# Patient Record
Sex: Female | Born: 2000 | Hispanic: Yes | Marital: Single | Smoking: Never smoker
Health system: Southern US, Community
[De-identification: ages and names within clinical notes are randomized; demographics above are authoritative.]

## PROBLEM LIST (undated history)

## (undated) DIAGNOSIS — F419 Anxiety disorder, unspecified: Secondary | ICD-10-CM

## (undated) DIAGNOSIS — F32A Depression, unspecified: Secondary | ICD-10-CM

---

## 2019-12-20 ENCOUNTER — Encounter (HOSPITAL_COMMUNITY): Payer: Self-pay

## 2019-12-20 ENCOUNTER — Ambulatory Visit (HOSPITAL_COMMUNITY)
Admission: EM | Admit: 2019-12-20 | Discharge: 2019-12-20 | Disposition: A | Discharge status: Home / Self Care | Attending: Internal Medicine | Admitting: Internal Medicine

## 2019-12-20 ENCOUNTER — Other Ambulatory Visit: Payer: Self-pay

## 2019-12-20 DIAGNOSIS — N10 Acute tubulo-interstitial nephritis: Secondary | ICD-10-CM | POA: Diagnosis not present

## 2019-12-20 LAB — POCT URINALYSIS DIP (DEVICE)
Bilirubin Urine: NEGATIVE
Glucose, UA: NEGATIVE mg/dL
Ketones, ur: NEGATIVE mg/dL
Nitrite: POSITIVE — AB
Protein, ur: NEGATIVE mg/dL
Specific Gravity, Urine: 1.02 (ref 1.005–1.030)
Urobilinogen, UA: 0.2 mg/dL (ref 0.0–1.0)
pH: 6 (ref 5.0–8.0)

## 2019-12-20 MED ORDER — CIPROFLOXACIN HCL 500 MG PO TABS: 500.0000 mg | ORAL_TABLET | Freq: Two times a day (BID) | ORAL | 0 refills | Status: DC

## 2019-12-20 NOTE — ED Provider Notes (Signed)
Anton    CSN: [id number] Arrival date & time: 12/20/19  1633      History   Chief Complaint Chief Complaint  Patient presents with  . Urinary Tract Infection    HPI Lindsey Castro is a 19 y.o. female comes to urgent care with complaints of left flank pain, chills, abdominal pain and vomiting of 2 days duration.  Patient says symptoms started a couple of days ago and has been persistent.  She denies any dysuria urgency but admits to having some frequency.  No vaginal discharge.  Patient is sexually active but uses condoms consistently.  No dizziness, near syncope or syncopal episodes.  Patient denies any history of bloody urine or kidney stones.Marland Kitchen   HPI  History reviewed. No pertinent past medical history.  There are no problems to display for this patient.   History reviewed. No pertinent surgical history.  OB History   No obstetric history on file.      Home Medications    Prior to Admission medications   Medication Sig Start Date End Date Taking? Authorizing Provider  ciprofloxacin (CIPRO) 500 MG tablet Take 1 tablet (500 mg total) by mouth 2 (two) times daily for 5 days. 12/20/19 12/25/19  Chase Picket, MD    Family History Family History  Problem Relation Age of Onset  . Healthy Mother   . Healthy Father     Social History Social History   Tobacco Use  . Smoking status: Never Smoker  . Smokeless tobacco: Never Used  Substance Use Topics  . Alcohol use: Yes  . Drug use: Never     Allergies   Patient has no known allergies.   Review of Systems Review of Systems  Constitutional: Positive for activity change, chills and fever. Negative for fatigue.  Gastrointestinal: Positive for abdominal pain. Negative for nausea and vomiting.  Genitourinary: Positive for flank pain and frequency. Negative for dyspareunia, dysuria, urgency, vaginal bleeding and vaginal discharge.  Musculoskeletal: Negative for arthralgias and gait problem.   Neurological: Negative for dizziness, light-headedness and headaches.  Psychiatric/Behavioral: Negative for confusion and decreased concentration.     Physical Exam Triage Vital Signs ED Triage Vitals  Enc Vitals Group     BP 12/20/19 1737 111/66     Pulse Rate 12/20/19 1737 (!) 109     Resp 12/20/19 1737 16     Temp 12/20/19 1737 (!) 100.7 F (38.2 C)     Temp Source 12/20/19 1737 Oral     SpO2 12/20/19 1737 100 %     Weight 12/20/19 1735 148 lb 9.6 oz (67.4 kg)     Height --      Head Circumference --      Peak Flow --      Pain Score 12/20/19 1734 0     Pain Loc --      Pain Edu? --      Excl. in [place]? --    No data found.  Updated Vital Signs BP 111/66 (BP Location: Right Arm)   Pulse (!) 109   Temp (!) 100.7 F (38.2 C) (Oral)   Resp 16   Wt 67.4 kg   LMP 11/27/2019   SpO2 100%   Visual Acuity Right Eye Distance:   Left Eye Distance:   Bilateral Distance:    Right Eye Near:   Left Eye Near:    Bilateral Near:     Physical Exam Cardiovascular:     Rate and Rhythm: Normal rate and regular  rhythm.  Pulmonary:     Effort: Pulmonary effort is normal.     Breath sounds: Normal breath sounds.  Abdominal:     General: Bowel sounds are normal.     Palpations: Abdomen is soft.     Tenderness: There is no abdominal tenderness. There is left CVA tenderness. There is no rebound.  Musculoskeletal:        General: No swelling or signs of injury. Normal range of motion.     Right lower leg: No edema.  Skin:    General: Skin is warm.     Capillary Refill: Capillary refill takes less than 2 seconds.     Findings: No bruising or erythema.      UC Treatments / Results  Labs (all labs ordered are listed, but only abnormal results are displayed) Labs Reviewed  POCT URINALYSIS DIP (DEVICE) - Abnormal; Notable for the following components:      Result Value   Hgb urine dipstick SMALL (*)    Nitrite POSITIVE (*)    Leukocytes,Ua SMALL (*)    All other components  within normal limits  URINE CULTURE    EKG   Radiology No results found.  Procedures Procedures (including critical care time)  Medications Ordered in UC Medications - No data to display  Initial Impression / Assessment and Plan / UC Course  I have reviewed the triage vital signs and the nursing notes.  Pertinent labs & imaging results that were available during my care of the patient were reviewed by me and considered in my medical decision making (see chart for details).     1.  Acute left pyelonephritis: Urinalysis is significant for blood, leukocyte esterase and nitrites. Ciprofloxacin 500 mg twice daily for 5 days Urine cultures have been sent Return precautions given If patient develops any worsening of abdominal pain, flank pain, nausea or vomiting she is advised to return to urgent care to be reevaluated. Final Clinical Impressions(s) / UC Diagnoses   Final diagnoses:  Acute pyelonephritis   Discharge Instructions   None    ED Prescriptions    Medication Sig Dispense Auth. Provider   ciprofloxacin (CIPRO) 500 MG tablet Take 1 tablet (500 mg total) by mouth 2 (two) times daily for 5 days. 10 tablet Santez Woodcox, Britta Mccreedy, MD     PDMP not reviewed this encounter.   Merrilee Jansky, MD 12/21/19 [phone]

## 2019-12-20 NOTE — ED Triage Notes (Signed)
Pt is here with lower back pain, chills, abdominal pain, vomitted that started 2 hours ago. Pt has not taken any meds to relieve discomfort.

## 2019-12-22 ENCOUNTER — Other Ambulatory Visit: Payer: Self-pay

## 2019-12-22 ENCOUNTER — Emergency Department (HOSPITAL_COMMUNITY)
Admission: EM | Admit: 2019-12-22 | Discharge: 2019-12-22 | Disposition: A | Discharge status: Home / Self Care | Attending: Emergency Medicine | Admitting: Emergency Medicine

## 2019-12-22 ENCOUNTER — Encounter (HOSPITAL_COMMUNITY): Payer: Self-pay | Admitting: Emergency Medicine

## 2019-12-22 DIAGNOSIS — Z79899 Other long term (current) drug therapy: Secondary | ICD-10-CM | POA: Insufficient documentation

## 2019-12-22 DIAGNOSIS — N12 Tubulo-interstitial nephritis, not specified as acute or chronic: Secondary | ICD-10-CM | POA: Insufficient documentation

## 2019-12-22 DIAGNOSIS — R109 Unspecified abdominal pain: Secondary | ICD-10-CM | POA: Diagnosis present

## 2019-12-22 LAB — CBC
HCT: 42 % (ref 36.0–46.0)
Hemoglobin: 13.4 g/dL (ref 12.0–15.0)
MCH: 30.9 pg (ref 26.0–34.0)
MCHC: 31.9 g/dL (ref 30.0–36.0)
MCV: 96.8 fL (ref 80.0–100.0)
Platelets: 197 10*3/uL (ref 150–400)
RBC: 4.34 MIL/uL (ref 3.87–5.11)
RDW: 12.9 % (ref 11.5–15.5)
WBC: 13.1 10*3/uL — ABNORMAL HIGH (ref 4.0–10.5)
nRBC: 0 % (ref 0.0–0.2)

## 2019-12-22 LAB — URINE CULTURE: Culture: >=100000 — AB

## 2019-12-22 LAB — BASIC METABOLIC PANEL
Anion gap: 10 (ref 5–15)
BUN: 11 mg/dL (ref 6–20)
CO2: 25 mmol/L (ref 22–32)
Calcium: 9 mg/dL (ref 8.9–10.3)
Chloride: 105 mmol/L (ref 98–111)
Creatinine, Ser: 0.91 mg/dL (ref 0.44–1.00)
GFR calc Af Amer: >60 mL/min (ref >60)
GFR calc non Af Amer: >60 mL/min (ref >60)
Glucose, Bld: 112 mg/dL — ABNORMAL HIGH (ref 70–99)
Potassium: 3.4 mmol/L — ABNORMAL LOW (ref 3.5–5.1)
Sodium: 140 mmol/L (ref 135–145)

## 2019-12-22 LAB — URINALYSIS, ROUTINE W REFLEX MICROSCOPIC
Bilirubin Urine: NEGATIVE
Glucose, UA: NEGATIVE mg/dL
Ketones, ur: 20 mg/dL — AB
Nitrite: POSITIVE — AB
Protein, ur: 100 mg/dL — AB
RBC / HPF: >50 RBC/hpf — ABNORMAL HIGH (ref 0–5)
Specific Gravity, Urine: 1.017 (ref 1.005–1.030)
pH: 6 (ref 5.0–8.0)

## 2019-12-22 LAB — PREGNANCY, URINE: Preg Test, Ur: NEGATIVE

## 2019-12-22 MED ORDER — SODIUM CHLORIDE 0.9 % IV SOLN
1.0000 g | Freq: Once | INTRAVENOUS | Status: AC
  Administered 2019-12-22: 1 g via INTRAVENOUS
  Filled 2019-12-22: qty 10

## 2019-12-22 MED ORDER — HYDROCODONE-ACETAMINOPHEN 5-325 MG PO TABS
1.0000 | ORAL_TABLET | Freq: Once | ORAL | Status: AC
  Administered 2019-12-22: 1 via ORAL
  Filled 2019-12-22: qty 1

## 2019-12-22 MED ORDER — ONDANSETRON HCL 4 MG/2ML IJ SOLN
4.0000 mg | Freq: Once | INTRAMUSCULAR | Status: AC
  Administered 2019-12-22: 4 mg via INTRAVENOUS
  Filled 2019-12-22: qty 2

## 2019-12-22 MED ORDER — KETOROLAC TROMETHAMINE 30 MG/ML IJ SOLN
15.0000 mg | Freq: Once | INTRAMUSCULAR | Status: AC
  Administered 2019-12-22: 15 mg via INTRAVENOUS
  Filled 2019-12-22: qty 1

## 2019-12-22 MED ORDER — SODIUM CHLORIDE 0.9 % IV BOLUS
1000.0000 mL | Freq: Once | INTRAVENOUS | Status: AC
  Administered 2019-12-22: 1000 mL via INTRAVENOUS

## 2019-12-22 MED ORDER — ONDANSETRON 4 MG PO TBDP: 4.0000 mg | ORAL_TABLET | Freq: Three times a day (TID) | ORAL | 0 refills | Status: DC | PRN

## 2019-12-22 MED ORDER — CEPHALEXIN 500 MG PO CAPS: 500.0000 mg | ORAL_CAPSULE | Freq: Four times a day (QID) | ORAL | 0 refills | Status: AC

## 2019-12-22 NOTE — ED Triage Notes (Signed)
Patient is complaining of vomiting, left lower back pain, and nausea. Patient went to urgent care two days ago and patient states the pharmacy lost her prescription.

## 2019-12-22 NOTE — ED Provider Notes (Signed)
[facility] DEPT Provider Note   CSN: [id number] Arrival date & time: 12/22/19  0034     History Chief Complaint  Patient presents with  . Urinary Tract Infection    Lindsey Castro is a 19 y.o. female.  19 year old female presents to the emergency department for evaluation of persistent left-sided flank pain. She states that pain has been worsening over the past 2 days. She initially presented to urgent care 48 hours ago and was diagnosed with a UTI. She was unable to pick up her antibiotic and her symptoms worsened today. She had 2 episodes of vomiting prior to arrival. Reports persistent urinary frequency and urgency with darker color to her urine. She has not noted any fevers, but does report cold chills.  Denies hematuria, dysuria, bowel changes, hx of abdominal surgeries.  Urine culture from 12/20/19 resulting with >100,000 colonies gram negative rods.  The history is provided by the patient. No language interpreter was used.  Urinary Tract Infection      History reviewed. No pertinent past medical history.  There are no problems to display for this patient.   History reviewed. No pertinent surgical history.   OB History   No obstetric history on file.     Family History  Problem Relation Age of Onset  . Healthy Mother   . Healthy Father     Social History   Tobacco Use  . Smoking status: Never Smoker  . Smokeless tobacco: Never Used  Substance Use Topics  . Alcohol use: Yes  . Drug use: Never    Home Medications Prior to Admission medications   Medication Sig Start Date End Date Taking? Authorizing Provider  busPIRone (BUSPAR) 5 MG tablet Take 10 mg by mouth daily as needed for anxiety. 09/09/19  Yes [provider]  FLUoxetine (PROZAC) 20 MG capsule Take 20 mg by mouth daily. 12/04/19  Yes [provider]  cephALEXin (KEFLEX) 500 MG capsule Take 1 capsule (500 mg total) by mouth 4 (four) times daily for 10  days. 12/22/19 01/01/20  Antonietta Breach, PA-C  ondansetron (ZOFRAN ODT) 4 MG disintegrating tablet Take 1 tablet (4 mg total) by mouth every 8 (eight) hours as needed for nausea or vomiting. 12/22/19   Antonietta Breach, PA-C    Allergies    Patient has no known allergies.  Review of Systems   Review of Systems  Ten systems reviewed and are negative for acute change, except as noted in the HPI.    Physical Exam Updated Vital Signs BP (!) 101/57   Pulse (!) 120   Temp 99.7 F (37.6 C) (Oral)   Resp 18   Ht 5\' 4"  (1.626 m)   Wt 62.6 kg   LMP 11/27/2019   SpO2 99%   BMI 23.69 kg/m   Physical Exam Vitals and nursing note reviewed.  Constitutional:      General: She is not in acute distress.    Appearance: She is well-developed. She is not diaphoretic.     Comments: Tearful. Nontoxic.  HENT:     Head: Normocephalic and atraumatic.  Eyes:     General: No scleral icterus.    Conjunctiva/sclera: Conjunctivae normal.  Pulmonary:     Effort: Pulmonary effort is normal. No respiratory distress.     Comments: Respirations even and unlabored Abdominal:     Palpations: Abdomen is soft. There is no mass.     Tenderness: There is abdominal tenderness (mild, L mid abdomen). There is no guarding.  Comments: Soft, nondistended. No peritoneal signs or guarding.  Musculoskeletal:        General: Normal range of motion.     Cervical back: Normal range of motion.  Skin:    General: Skin is warm and dry.     Coloration: Skin is not pale.     Findings: No erythema or rash.  Neurological:     General: No focal deficit present.     Mental Status: She is alert and oriented to person, place, and time.     Coordination: Coordination normal.  Psychiatric:        Behavior: Behavior normal.     ED Results / Procedures / Treatments   Labs (all labs ordered are listed, but only abnormal results are displayed) Labs Reviewed  URINALYSIS, ROUTINE W REFLEX MICROSCOPIC - Abnormal; Notable for the  following components:      Result Value   APPearance CLOUDY (*)    Hgb urine dipstick LARGE (*)    Ketones, ur 20 (*)    Protein, ur 100 (*)    Nitrite POSITIVE (*)    Leukocytes,Ua LARGE (*)    RBC / HPF >50 (*)    Bacteria, UA RARE (*)    All other components within normal limits  CBC - Abnormal; Notable for the following components:   WBC 13.1 (*)    All other components within normal limits  BASIC METABOLIC PANEL - Abnormal; Notable for the following components:   Potassium 3.4 (*)    Glucose, Bld 112 (*)    All other components within normal limits  PREGNANCY, URINE    EKG None  Radiology No results found.  Procedures Procedures (including critical care time)  Medications Ordered in ED Medications  sodium chloride 0.9 % bolus 1,000 mL (0 mLs Intravenous Stopped 12/22/19 0244)  cefTRIAXone (ROCEPHIN) 1 g in sodium chloride 0.9 % 100 mL IVPB (0 g Intravenous Stopped 12/22/19 0244)  ketorolac (TORADOL) 30 MG/ML injection 15 mg (15 mg Intravenous Given 12/22/19 0143)  HYDROcodone-acetaminophen (NORCO/VICODIN) 5-325 MG per tablet 1 tablet (1 tablet Oral Given 12/22/19 0132)  ondansetron (ZOFRAN) injection 4 mg (4 mg Intravenous Given 12/22/19 0143)    ED Course  I have reviewed the triage vital signs and the nursing notes.  Pertinent labs & imaging results that were available during my care of the patient were reviewed by me and considered in my medical decision making (see chart for details).  Clinical Course as of Dec 21 253  Wed Dec 22, 2019  0253 Patient no longer tachycardic.  She states that she is feeling better.  Plan for discharge on Keflex 4 times daily.   [KH]    Clinical Course User Index [KH] Antony Madura, PA-C   MDM Rules/Calculators/A&P                      19 year old nontoxic appearing female with clinical findings consistent with uncomplicated pyelonephritis.  She was noted to be tachycardic on arrival.  This has improved with pain control and IV  fluids.  Given initial dose of IV Rocephin as urine culture from 2 days ago positive for gram-negative rods; likely E. coli.  Her urinalysis today reflective of persistent cystitis.  While she does have large hemoglobin and >50 RBCs, this appears to be a progression from her initial UA and is favored to be due to hemorrhagic cystitis.  Will discharge on Keflex with instruction for PCP follow up.  Return precautions discussed and provided.  Patient discharged in stable condition with no unaddressed concerns.   Final Clinical Impression(s) / ED Diagnoses Final diagnoses:  Pyelonephritis    Rx / DC Orders ED Discharge Orders         Ordered    cephALEXin (KEFLEX) 500 MG capsule  4 times daily     12/22/19 0254    ondansetron (ZOFRAN ODT) 4 MG disintegrating tablet  Every 8 hours PRN     12/22/19 0255           Antony Madura, PA-C 12/22/19 0258    Molpus, Jonny Ruiz, MD 12/22/19 [phone]

## 2019-12-22 NOTE — ED Triage Notes (Signed)
Patient requested to urinate. Patient states she already has used the bathroom.

## 2019-12-22 NOTE — ED Notes (Addendum)
PT currently using lobby restroom

## 2019-12-22 NOTE — Discharge Instructions (Signed)
Take Keflex as prescribed until finished.  We recommend 600 mg ibuprofen every 6 hours for management of pain.  You may use Zofran as prescribed for nausea management.  Follow-up with a primary care doctor to ensure resolution of symptoms.

## 2019-12-24 ENCOUNTER — Ambulatory Visit (HOSPITAL_COMMUNITY)
Admission: EM | Admit: 2019-12-24 | Discharge: 2019-12-24 | Disposition: A | Discharge status: Home / Self Care | Attending: Internal Medicine | Admitting: Internal Medicine

## 2019-12-24 ENCOUNTER — Encounter (HOSPITAL_COMMUNITY): Payer: Self-pay

## 2019-12-24 ENCOUNTER — Other Ambulatory Visit: Payer: Self-pay

## 2019-12-24 ENCOUNTER — Ambulatory Visit (INDEPENDENT_AMBULATORY_CARE_PROVIDER_SITE_OTHER)

## 2019-12-24 DIAGNOSIS — N10 Acute tubulo-interstitial nephritis: Secondary | ICD-10-CM | POA: Diagnosis not present

## 2019-12-24 DIAGNOSIS — R109 Unspecified abdominal pain: Secondary | ICD-10-CM | POA: Diagnosis not present

## 2019-12-24 NOTE — ED Provider Notes (Signed)
Lindsey Castro    CSN: [id number] Arrival date & time: 12/24/19  0809      History   Chief Complaint Chief Complaint  Patient presents with  . Kidney Infection    HPI Lindsey Castro is a 19 y.o. female recently diagnosed with pyelonephritis and being treated with Keflex 500 mg 4 times daily for 10 days comes to urgent care with complaint of bloody urine since yesterday.  Patient was initially diagnosed with pyelonephritis by me.  At that time I prescribed Keflex but the patient did not fill that prescription.  She went to the emergency department on account of worsening symptoms and the dose of Keflex was increased to 4 times daily for 10 days.  Patient is on day 2 of Keflex and she noticed increased bloody urine.  Left flank pain is improved.  No fever or chills.  No nausea or vomiting.  No dizziness, near syncope or syncopal episodes.  Patient came to urgent care to be reevaluated.  Urine cultures are growing pansensitive E. coli.  HPI  History reviewed. No pertinent past medical history.  There are no problems to display for this patient.   History reviewed. No pertinent surgical history.  OB History   No obstetric history on file.      Home Medications    Prior to Admission medications   Medication Sig Start Date End Date Taking? Authorizing Provider  busPIRone (BUSPAR) 5 MG tablet Take 10 mg by mouth daily as needed for anxiety. 09/09/19   [provider]  cephALEXin (KEFLEX) 500 MG capsule Take 1 capsule (500 mg total) by mouth 4 (four) times daily for 10 days. 12/22/19 01/01/20  Antonietta Breach, PA-C  FLUoxetine (PROZAC) 20 MG capsule Take 20 mg by mouth daily. 12/04/19   [provider]  ondansetron (ZOFRAN ODT) 4 MG disintegrating tablet Take 1 tablet (4 mg total) by mouth every 8 (eight) hours as needed for nausea or vomiting. 12/22/19   Antonietta Breach, PA-C    Family History Family History  Problem Relation Age of Onset  . Healthy Mother   .  Healthy Father     Social History Social History   Tobacco Use  . Smoking status: Never Smoker  . Smokeless tobacco: Never Used  Substance Use Topics  . Alcohol use: Yes  . Drug use: Never     Allergies   Patient has no known allergies.   Review of Systems Review of Systems  Constitutional: Negative for activity change, chills and fatigue.  Respiratory: Negative for cough, chest tightness and shortness of breath.   Gastrointestinal: Positive for abdominal pain. Negative for diarrhea, nausea and vomiting.  Genitourinary: Positive for flank pain. Negative for dysuria, frequency and urgency.  Musculoskeletal: Negative for arthralgias, gait problem and joint swelling.  Neurological: Negative for dizziness, numbness and headaches.     Physical Exam Triage Vital Signs ED Triage Vitals  Enc Vitals Group     BP 12/24/19 0831 101/61     Pulse Rate 12/24/19 0831 87     Resp 12/24/19 0831 18     Temp 12/24/19 0831 99.1 F (37.3 C)     Temp Source 12/24/19 0831 Oral     SpO2 12/24/19 0831 99 %     Weight --      Height --      Head Circumference --      Peak Flow --      Pain Score 12/24/19 0832 2     Pain Loc --  Pain Edu? --      Excl. in [place]? --    No data found.  Updated Vital Signs BP 101/61 (BP Location: Right Arm)   Pulse 87   Temp 99.1 F (37.3 C) (Oral)   Resp 18   LMP 11/27/2019   SpO2 99%   Visual Acuity Right Eye Distance:   Left Eye Distance:   Bilateral Distance:    Right Eye Near:   Left Eye Near:    Bilateral Near:     Physical Exam Constitutional:      General: She is not in acute distress.    Appearance: She is not ill-appearing.  Cardiovascular:     Rate and Rhythm: Normal rate and regular rhythm.     Pulses: Normal pulses.     Heart sounds: Normal heart sounds.  Pulmonary:     Effort: Pulmonary effort is normal.     Breath sounds: Normal breath sounds. No wheezing or rhonchi.  Abdominal:     General: Bowel sounds are normal.  There is no distension.     Palpations: Abdomen is soft.     Tenderness: There is no abdominal tenderness.     Hernia: No hernia is present.  Skin:    General: Skin is warm and dry.     Capillary Refill: Capillary refill takes less than 2 seconds.     Coloration: Skin is not jaundiced.     Findings: No erythema.  Neurological:     General: No focal deficit present.     Mental Status: She is alert.      UC Treatments / Results  Labs (all labs ordered are listed, but only abnormal results are displayed) Labs Reviewed - No data to display  EKG   Radiology DG Abd 1 View  Result Date: 12/24/2019 CLINICAL DATA:  Flank pain, LEFT-sided flank pain EXAM: ABDOMEN - 1 VIEW COMPARISON:  None. FINDINGS: No dilated loops of large or small bowel. No pathologic calcifications. No nephrolithiasis ureterolithiasis identified. No organomegaly. No aggressive osseous lesion. No intraperitoneal free air. IMPRESSION: 1. No radiographic evidence of nephrolithiasis or ureterolithiasis. 2. Normal bowel-gas pattern Electronically Signed   By: Genevive Bi M.D.   On: 12/24/2019 09:11    Procedures Procedures (including critical care time)  Medications Ordered in UC Medications - No data to display  Initial Impression / Assessment and Plan / UC Course  I have reviewed the triage vital signs and the nursing notes.  Pertinent labs & imaging results that were available during my care of the patient were reviewed by me and considered in my medical decision making (see chart for details).     1.  Acute pyelonephritis, currently on Keflex: Patient is encouraged to continue current treatment regimen Clinically she is improved and his symptoms have improved with that. Patient is advised to be compliant with her medications. Return precautions given. KUB is negative for nephrolithiasis or ureterolithiasis. Final Clinical Impressions(s) / UC Diagnoses   Final diagnoses:  Acute pyelonephritis      Discharge Instructions     Increase oral fluid intake Continue antibiotic use If abdominal pain worsens please go to the ED for further evaluation.   ED Prescriptions    None     PDMP not reviewed this encounter.   Merrilee Jansky, MD 12/25/19 [phone]

## 2019-12-24 NOTE — Discharge Instructions (Signed)
Increase oral fluid intake Continue antibiotic use If abdominal pain worsens please go to the ED for further evaluation.

## 2019-12-24 NOTE — ED Triage Notes (Signed)
Pt presents for blood in urine from kidney infection; pt was seen on Monday for UTI and given antibiotics, Wednesday it progressed to a kidney infection she was then seen in the ED at [facility] long and switched antibiotics but she states she has had a large amount of blood in her urine since yesterday.

## 2020-01-20 ENCOUNTER — Other Ambulatory Visit: Payer: Self-pay

## 2020-01-20 ENCOUNTER — Encounter (HOSPITAL_COMMUNITY): Payer: Self-pay

## 2020-01-20 DIAGNOSIS — R509 Fever, unspecified: Secondary | ICD-10-CM | POA: Insufficient documentation

## 2020-01-20 DIAGNOSIS — N3000 Acute cystitis without hematuria: Secondary | ICD-10-CM | POA: Diagnosis not present

## 2020-01-20 DIAGNOSIS — R1031 Right lower quadrant pain: Secondary | ICD-10-CM | POA: Diagnosis present

## 2020-01-20 DIAGNOSIS — R1110 Vomiting, unspecified: Secondary | ICD-10-CM | POA: Diagnosis not present

## 2020-01-20 DIAGNOSIS — N200 Calculus of kidney: Secondary | ICD-10-CM | POA: Diagnosis not present

## 2020-01-20 NOTE — ED Triage Notes (Signed)
Pt taking keflex for uti and didn't take full course. Pain getting worse and spreading through back.

## 2020-01-21 ENCOUNTER — Emergency Department (HOSPITAL_COMMUNITY)
Admission: EM | Admit: 2020-01-21 | Discharge: 2020-01-21 | Disposition: A | Discharge status: Home / Self Care | Attending: Emergency Medicine | Admitting: Emergency Medicine

## 2020-01-21 ENCOUNTER — Emergency Department (HOSPITAL_COMMUNITY)

## 2020-01-21 DIAGNOSIS — N3000 Acute cystitis without hematuria: Secondary | ICD-10-CM

## 2020-01-21 DIAGNOSIS — N200 Calculus of kidney: Secondary | ICD-10-CM

## 2020-01-21 LAB — URINALYSIS, ROUTINE W REFLEX MICROSCOPIC
Bilirubin Urine: NEGATIVE
Glucose, UA: NEGATIVE mg/dL
Ketones, ur: 20 mg/dL — AB
Nitrite: POSITIVE — AB
Protein, ur: 30 mg/dL — AB
Specific Gravity, Urine: 1.019 (ref 1.005–1.030)
WBC, UA: >50 WBC/hpf — ABNORMAL HIGH (ref 0–5)
pH: 7 (ref 5.0–8.0)

## 2020-01-21 LAB — URINE CULTURE: Culture: <10000 — AB

## 2020-01-21 LAB — PREGNANCY, URINE: Preg Test, Ur: NEGATIVE

## 2020-01-21 MED ORDER — KETOROLAC TROMETHAMINE 30 MG/ML IJ SOLN
30.0000 mg | Freq: Once | INTRAMUSCULAR | Status: AC
  Administered 2020-01-21: 30 mg via INTRAVENOUS
  Filled 2020-01-21: qty 1

## 2020-01-21 MED ORDER — SODIUM CHLORIDE 0.9 % IV BOLUS
1000.0000 mL | Freq: Once | INTRAVENOUS | Status: AC
  Administered 2020-01-21: 1000 mL via INTRAVENOUS

## 2020-01-21 MED ORDER — ONDANSETRON 4 MG PO TBDP: 4.0000 mg | ORAL_TABLET | Freq: Three times a day (TID) | ORAL | 0 refills | Status: DC | PRN

## 2020-01-21 MED ORDER — SODIUM CHLORIDE 0.9 % IV SOLN
1.0000 g | Freq: Once | INTRAVENOUS | Status: AC
  Administered 2020-01-21: 1 g via INTRAVENOUS
  Filled 2020-01-21: qty 10

## 2020-01-21 MED ORDER — ONDANSETRON HCL 4 MG/2ML IJ SOLN
4.0000 mg | Freq: Once | INTRAMUSCULAR | Status: AC
  Administered 2020-01-21: 4 mg via INTRAVENOUS
  Filled 2020-01-21: qty 2

## 2020-01-21 MED ORDER — CEPHALEXIN 500 MG PO CAPS: 500.0000 mg | ORAL_CAPSULE | Freq: Four times a day (QID) | ORAL | 0 refills | Status: AC

## 2020-01-21 MED ORDER — ACETAMINOPHEN 325 MG PO TABS
650.0000 mg | ORAL_TABLET | Freq: Once | ORAL | Status: AC
  Administered 2020-01-21: 650 mg via ORAL
  Filled 2020-01-21: qty 2

## 2020-01-21 NOTE — Discharge Instructions (Addendum)
Thank you for allowing me to care for you today in the Emergency Department.   Take 1 tablet of Keflex every 6 hours for the next 7 days.  It is very important you take the entire course of antibiotics.   Take 650 mg of Tylenol or 600 mg of ibuprofen with food every 6 hours for pain or fever.  You can alternate between these 2 medications every 3 hours if your pain returns.  For instance, you can take Tylenol at noon, followed by a dose of ibuprofen at 3, followed by second dose of Tylenol and 6.  Make sure that you are drinking plenty of fluids.  Let 1 tablet of Zofran dissolve in your tongue every 8 hours as needed for nausea or vomiting.  I did not see any specific guidelines about not being able to receive your Covid vaccine if you may have infectious symptoms.  I would recommend calling the clinic where you were going to get the vaccine to ensure that they do not have any additional guidelines.  Please get established with a primary care provider.  Call the [facility] and see if you are eligible since you have [employer].  If not, you can call to get established with a primary care provider using the number on your discharge paperwork.  If you are unable to get seen this week, please follow-up with urgent care to have a repeat urinalysis in 1 week to make sure that your symptoms have resolved.  Return to the emergency department if you develop persistent vomiting despite taking Zofran, high fevers after you have been taking antibiotics for greater than 48 hours, uncontrollable pain, or other new, concerning symptoms.

## 2020-01-21 NOTE — ED Provider Notes (Signed)
[facility]-EMERGENCY DEPT Provider Note   CSN: [id number] Arrival date & time: 01/20/20  2304     History Chief Complaint  Patient presents with  . Flank Pain    Lindsey Castro is a 19 y.o. female with no significant past medical history who presents to the emergency department with a chief complaint of right flank pain.  The patient reports that she developed left flank pain, chills, abdominal pain, and vomiting around mid March and was seen at urgent care on March 15 and was diagnosed with pyelonephritis and was started on ciprofloxacin.  She did not start taking antibiotics and symptoms continue to worsen so she went to the ER on March 17.  She was started on Keflex 500 mg every 6 hours.  She had an abdominal x-ray that was unremarkable.  She then returned to urgent care on 3/19 with hematuria and reported to be on day 2 of Keflex.  She states that she only took approximately 4 to 5 days of the entire course of Keflex before she stopped taking the medication.   She reports that symptoms seem to resolve until yesterday when she developed right flank pain, lower abdominal pain, dysuria, and fever.  Pain is constant and has significantly worsened over the last 24 hours.  No known aggravating or alleviating factors.  She had 2 episodes of vomiting last night, but this has since resolved.  She denies constipation, diarrhea, vaginal pain or discharge, cough, shortness of breath, chest pain.  She does note that she has not had her menses in over a month since she initially presented with pyelonephritis.  The history is provided by the patient. No language interpreter was used.       History reviewed. No pertinent past medical history.  There are no problems to display for this patient.   History reviewed. No pertinent surgical history.   OB History   No obstetric history on file.     Family History  Problem Relation Age of Onset  . Healthy Mother   . Healthy  Father     Social History   Tobacco Use  . Smoking status: Never Smoker  . Smokeless tobacco: Never Used  Substance Use Topics  . Alcohol use: Yes  . Drug use: Never    Home Medications Prior to Admission medications   Medication Sig Start Date End Date Taking? Authorizing Provider  busPIRone (BUSPAR) 5 MG tablet Take 10 mg by mouth daily as needed for anxiety. 09/09/19  Yes [provider]  FLUoxetine (PROZAC) 20 MG capsule Take 20 mg by mouth daily. 12/04/19  Yes [provider]  cephALEXin (KEFLEX) 500 MG capsule Take 1 capsule (500 mg total) by mouth 4 (four) times daily for 7 days. 01/21/20 01/28/20  Gustave Lindeman A, PA-C  ondansetron (ZOFRAN ODT) 4 MG disintegrating tablet Take 1 tablet (4 mg total) by mouth every 8 (eight) hours as needed. 01/21/20   Jaysiah Marchetta A, PA-C    Allergies    Patient has no known allergies.  Review of Systems   Review of Systems  Constitutional: Positive for chills and fever. Negative for activity change.  Respiratory: Negative for shortness of breath.   Cardiovascular: Negative for chest pain.  Gastrointestinal: Positive for abdominal pain and vomiting. Negative for blood in stool, diarrhea and nausea.  Genitourinary: Positive for dysuria and flank pain. Negative for hematuria, urgency, vaginal bleeding, vaginal discharge and vaginal pain.  Musculoskeletal: Negative for back pain.  Skin: Negative for rash.  Allergic/Immunologic: Negative for immunocompromised state.  Neurological: Negative for weakness, numbness and headaches.  Psychiatric/Behavioral: Negative for confusion.    Physical Exam Updated Vital Signs BP (!) 96/59 (BP Location: Left Arm)   Pulse 72   Temp 97.9 F (36.6 C) (Oral)   Resp 16   Ht 5\' 4"  (1.626 m)   Wt 67.1 kg   SpO2 99%   BMI 25.40 kg/m   Physical Exam Vitals and nursing note reviewed.  Constitutional:      General: She is not in acute distress.    Comments: Nontoxic-appearing.  HENT:      Head: Normocephalic.  Eyes:     Conjunctiva/sclera: Conjunctivae normal.  Cardiovascular:     Rate and Rhythm: Regular rhythm. Tachycardia present.     Heart sounds: No murmur. No friction rub. No gallop.   Pulmonary:     Effort: Pulmonary effort is normal. No respiratory distress.     Breath sounds: No stridor. No wheezing, rhonchi or rales.  Chest:     Chest wall: No tenderness.  Abdominal:     General: There is no distension.     Palpations: Abdomen is soft. There is no mass.     Tenderness: There is abdominal tenderness. There is right CVA tenderness and left CVA tenderness. There is no guarding or rebound.     Hernia: No hernia is present.     Comments: Significant right CVA tenderness.  She has mild left CVA tenderness.  She is also tender to palpation in the right lower quadrant.  No rebound or guarding.  She is also tender over McBurney's point.  No suprapubic tenderness.  No upper abdominal tenderness.  Minimal tenderness in left lower quadrant.  Musculoskeletal:     Cervical back: Neck supple.  Skin:    General: Skin is warm.     Findings: No rash.  Neurological:     Mental Status: She is alert.  Psychiatric:        Behavior: Behavior normal.     ED Results / Procedures / Treatments   Labs (all labs ordered are listed, but only abnormal results are displayed) Labs Reviewed  URINALYSIS, ROUTINE W REFLEX MICROSCOPIC - Abnormal; Notable for the following components:      Result Value   APPearance CLOUDY (*)    Hgb urine dipstick SMALL (*)    Ketones, ur 20 (*)    Protein, ur 30 (*)    Nitrite POSITIVE (*)    Leukocytes,Ua MODERATE (*)    WBC, UA >50 (*)    Bacteria, UA RARE (*)    All other components within normal limits  URINE CULTURE  CULTURE, BLOOD (ROUTINE X 2)  CULTURE, BLOOD (ROUTINE X 2)  PREGNANCY, URINE    EKG None  Radiology CT Renal Stone Study  Result Date: 01/21/2020 CLINICAL DATA:  UTI EXAM: CT ABDOMEN AND PELVIS WITHOUT CONTRAST  TECHNIQUE: Multidetector CT imaging of the abdomen and pelvis was performed following the standard protocol without IV contrast. COMPARISON:  None. FINDINGS: Lower chest:  No contributory findings. Hepatobiliary: No focal liver abnormality.No evidence of biliary obstruction or stone. Pancreas: Unremarkable. Spleen: Unremarkable. Adrenals/Urinary Tract: Negative adrenals. No hydronephrosis or ureteral stone. Hazy medullary increased density from concentrated urine or microcalcifications. 3 or 4 left renal calculi measuring up to 3 mm. Left upper pole renal cortical scarring. Punctate upper pole calculus on the right. No convincing sign of pyelonephritis. Borderline bladder wall thickening but no perivesicular fat stranding. Stomach/Bowel:  No obstruction. No appendicitis.  Vascular/Lymphatic: No acute vascular abnormality. No mass or adenopathy. Reproductive:No pathologic findings. Other: No ascites or pneumoperitoneum. Musculoskeletal: No acute abnormalities. IMPRESSION: 1. Bilateral nonobstructing renal calculi. No visible pyelonephritis. 2. Left renal cortical scarring. Electronically Signed   By: Monte Fantasia M.D.   On: 01/21/2020 05:03    Procedures Procedures (including critical care time)  Medications Ordered in ED Medications  acetaminophen (TYLENOL) tablet 650 mg (650 mg Oral Given 01/21/20 0305)  ketorolac (TORADOL) 30 MG/ML injection 30 mg (30 mg Intravenous Given 01/21/20 0415)  sodium chloride 0.9 % bolus 1,000 mL (0 mLs Intravenous Stopped 01/21/20 0536)  ondansetron (ZOFRAN) injection 4 mg (4 mg Intravenous Given 01/21/20 0414)  cefTRIAXone (ROCEPHIN) 1 g in sodium chloride 0.9 % 100 mL IVPB (0 g Intravenous Stopped 01/21/20 0536)    ED Course  I have reviewed the triage vital signs and the nursing notes.  Pertinent labs & imaging results that were available during my care of the patient were reviewed by me and considered in my medical decision making (see chart for details).    MDM  Rules/Calculators/A&P                      19 year old female with no significant past medical history presenting with fever, right flank pain, abdominal pain, and vomiting.   She was diagnosed with pyelonephritis last month but did not complete the entire course of antibiotics.  Febrile to 100.6 in the ER.  She has minimally tachycardic.  Normotensive.  There was one documented blood pressure of 90s over 60s, but I suspect this is positional as she is asymptomatic.  She is well-appearing.  Nontoxic.  Pregnancy test is negative.  UA with moderate leukocyte esterase, nitrite positive, pyuria.  Urine culture sent.  Will also obtain blood cultures   On exam, the patient does have right CVA tenderness, but also has right lower quadrant tenderness.  No specific suprapubic tenderness.  We will give a dose of Rocephin in the ER, Toradol, Zofran, and IV fluids.  Fever resolved with Tylenol.  Shared decision-making conversation given the patient's pain was primarily in the right lower quadrant.  Although I doubt appendicitis, given fever and her pain, want to ensure there is no underlying obstructive uropathy versus perinephric abscess.  There is a small amount of hemoglobinuria on UA.  CT renal stone study with bilateral nonobstructing renal calculi.  No visible pyelonephritis.  Borderline bladder wall thickening is noted, but there is no perivesicular fat stranding.   Discussed these findings with the patient.  Will discharge with Keflex, the patient was strongly been advised to take the entire course of medication.  I have also recommended that she have a repeat a urinalysis within the next week to ensure that her urinalysis has cleared.  All questions answered.  She is hemodynamically stable and in no acute distress.  Safe for discharge home with outpatient follow-up as indicated.   Final Clinical Impression(s) / ED Diagnoses Final diagnoses:  Bilateral nephrolithiasis  Acute cystitis without  hematuria    Rx / DC Orders ED Discharge Orders         Ordered    cephALEXin (KEFLEX) 500 MG capsule  4 times daily     01/21/20 0606    ondansetron (ZOFRAN ODT) 4 MG disintegrating tablet  Every 8 hours PRN     01/21/20 0606           Alyxandria Wentz A, PA-C 78/24/23 5361    Delora Fuel,  MD 01/21/20 6314

## 2020-01-21 NOTE — ED Notes (Signed)
Patient is requesting pain medication. PA made aware. 

## 2020-01-21 NOTE — ED Notes (Signed)
Lab contacted to add on urine pregnancy °

## 2020-01-26 LAB — CULTURE, BLOOD (ROUTINE X 2)
Culture: NO GROWTH
Culture: NO GROWTH
Special Requests: ADEQUATE
Special Requests: ADEQUATE

## 2020-05-31 ENCOUNTER — Ambulatory Visit (HOSPITAL_COMMUNITY)
Admission: EM | Admit: 2020-05-31 | Discharge: 2020-05-31 | Disposition: A | Discharge status: Home / Self Care | Attending: Emergency Medicine | Admitting: Emergency Medicine

## 2020-05-31 ENCOUNTER — Other Ambulatory Visit: Payer: Self-pay

## 2020-05-31 ENCOUNTER — Encounter (HOSPITAL_COMMUNITY): Payer: Self-pay

## 2020-05-31 DIAGNOSIS — N3001 Acute cystitis with hematuria: Secondary | ICD-10-CM | POA: Insufficient documentation

## 2020-05-31 DIAGNOSIS — N760 Acute vaginitis: Secondary | ICD-10-CM | POA: Diagnosis present

## 2020-05-31 DIAGNOSIS — H43392 Other vitreous opacities, left eye: Secondary | ICD-10-CM | POA: Diagnosis present

## 2020-05-31 DIAGNOSIS — B9689 Other specified bacterial agents as the cause of diseases classified elsewhere: Secondary | ICD-10-CM | POA: Diagnosis present

## 2020-05-31 DIAGNOSIS — Z113 Encounter for screening for infections with a predominantly sexual mode of transmission: Secondary | ICD-10-CM | POA: Insufficient documentation

## 2020-05-31 DIAGNOSIS — B373 Candidiasis of vulva and vagina: Secondary | ICD-10-CM | POA: Diagnosis present

## 2020-05-31 DIAGNOSIS — B3731 Acute candidiasis of vulva and vagina: Secondary | ICD-10-CM

## 2020-05-31 HISTORY — DX: Anxiety disorder, unspecified: F41.9

## 2020-05-31 HISTORY — DX: Depression, unspecified: F32.A

## 2020-05-31 LAB — POCT URINALYSIS DIPSTICK, ED / UC
Bilirubin Urine: NEGATIVE
Glucose, UA: NEGATIVE mg/dL
Ketones, ur: NEGATIVE mg/dL
Nitrite: NEGATIVE
Protein, ur: NEGATIVE mg/dL
Specific Gravity, Urine: 1.025 (ref 1.005–1.030)
Urobilinogen, UA: 0.2 mg/dL (ref 0.0–1.0)
pH: 6.5 (ref 5.0–8.0)

## 2020-05-31 LAB — POC URINE PREG, ED: Preg Test, Ur: NEGATIVE

## 2020-05-31 MED ORDER — TETRACAINE HCL 0.5 % OP SOLN
OPHTHALMIC | Status: AC
  Filled 2020-05-31: qty 4

## 2020-05-31 MED ORDER — CEPHALEXIN 500 MG PO CAPS: 500.0000 mg | ORAL_CAPSULE | Freq: Two times a day (BID) | ORAL | 0 refills | Status: DC

## 2020-05-31 MED ORDER — PHENAZOPYRIDINE HCL 200 MG PO TABS: 200.0000 mg | ORAL_TABLET | Freq: Three times a day (TID) | ORAL | 0 refills | Status: DC | PRN

## 2020-05-31 MED ORDER — FLUORESCEIN SODIUM 1 MG OP STRP
ORAL_STRIP | OPHTHALMIC | Status: AC
  Filled 2020-05-31: qty 1

## 2020-05-31 NOTE — ED Provider Notes (Addendum)
HPI  SUBJECTIVE:  Lindsey Castro is a 19 y.o. female who presents with 2 issues: First she reports urinary frequency for the past 2 weeks.  She reports dysuria described as "being uncomfortable", urgency, cloudy, odorous urine.  No hematuria.  Reports bilateral flank nominal pain that lasts seconds and spontaneously resolves.  There are no aggravating or alleviating factors.  No vomiting, fevers, back, pelvic pain body aches, flulike symptoms.  No vaginal bleeding, odor, discharge, genital rash.  She is in a relatively new monogamous relationship with a female who is asymptomatic.  She would like to be tested for STDs.  Last sexual contact was 2 weeks ago shortly before her symptoms started.  No recent antibiotics.  No antipyretic in the past 6 hours.  She tried increasing fluids with some improvement in symptoms.  No aggravating factors.  Unsure if this is similar to previous UTIs.    Second, she reports a small "black spot" in the periphery of her left eye that is located in the 4 o'clock position.  It seems to move when she moves her eyes.  She notices 2 days ago.  She denies eye pain, blurry or double vision, visual loss, foreign body sensation.  No eye discharge, conjunctival injection photophobia.  It has not changed in size since it started.  No stroke symptoms, headache.  She tried irrigating her eye without improvement in her symptoms.  No alleviating or aggravating factors.  Past medical history of frequent UTI, bilateral nonobstructing nephrolithiasis, pyelonephritis.  No history of diabetes, hypertension, gonorrhea, chlamydia, HIV, HSV, trichomonas, BV, yeast.  LMP: 8/16.  Denies the possibility of being pregnant.  PMD: In [place].  Past Medical History:  Diagnosis Date  . Anxiety   . Depression     History reviewed. No pertinent surgical history.  Family History  Problem Relation Age of Onset  . Healthy Mother   . Healthy Father     Social History   Tobacco Use  . Smoking  status: Never Smoker  . Smokeless tobacco: Never Used  Substance Use Topics  . Alcohol use: Yes  . Drug use: Never    No current facility-administered medications for this encounter.  Current Outpatient Medications:  .  busPIRone (BUSPAR) 5 MG tablet, Take 10 mg by mouth daily as needed for anxiety., Disp: , Rfl:  .  cephALEXin (KEFLEX) 500 MG capsule, Take 1 capsule (500 mg total) by mouth 2 (two) times daily., Disp: 14 capsule, Rfl: 0 .  fluconazole (DIFLUCAN) 150 MG tablet, Take 1 tablet (150 mg total) by mouth once for 1 dose. 1 tab po x 1. May repeat in 72 hours if no improvement, Disp: 2 tablet, Rfl: 1 .  FLUoxetine (PROZAC) 20 MG capsule, Take 20 mg by mouth daily., Disp: , Rfl:  .  metroNIDAZOLE (FLAGYL) 500 MG tablet, Take 1 tablet (500 mg total) by mouth 2 (two) times daily for 7 days., Disp: 14 tablet, Rfl: 0 .  phenazopyridine (PYRIDIUM) 200 MG tablet, Take 1 tablet (200 mg total) by mouth 3 (three) times daily as needed for pain., Disp: 6 tablet, Rfl: 0  No Known Allergies   ROS  As noted in HPI.   Physical Exam  BP (!) 99/55   Pulse 84   Temp 98.2 F (36.8 C)   Resp 16   LMP 05/22/2020   SpO2 99%   Constitutional: Well developed, well nourished, no acute distress Eyes: PERRLA EOMI, conjunctiva normal bilaterally.  No direct or consensual photophobia.  No foreign  body seen on lid eversion.  No pain with EOMs.  No hyphema.  No corneal abrasion seen on fluorescein exam left eye. Visual acuity: Left eye 20/25 Right eye 20/20 Visual fields intact to confrontation in all quadrants bilaterally HENT: Normocephalic, atraumatic,mucus membranes moist Respiratory: Normal inspiratory effort Cardiovascular: Normal rate GI: nondistended.  Positive suprapubic, left flank tenderness Back: No CVAT skin: No rash, skin intact Musculoskeletal: no deformities Neurologic: Alert & oriented x 3, no focal neuro deficits Psychiatric: Speech and behavior appropriate   ED  Course   Medications - No data to display  Orders Placed This Encounter  Procedures  . Urine culture    Standing Status:   Standing    Number of Occurrences:   1    Order Specific Question:   List patient's active antibiotics    Answer:   keflex  . POCT Urinalysis Dipstick (ED/UC)    Standing Status:   Standing    Number of Occurrences:   1  . POC urine preg, ED (not at [facility])    Standing Status:   Standing    Number of Occurrences:   1    Results for orders placed or performed during the hospital encounter of 05/31/20 (from the past 24 hour(s))  POCT Urinalysis Dipstick (ED/UC)     Status: Abnormal   Collection Time: 05/31/20  4:30 PM  Result Value Ref Range   Glucose, UA NEGATIVE NEGATIVE mg/dL   Bilirubin Urine NEGATIVE NEGATIVE   Ketones, ur NEGATIVE NEGATIVE mg/dL   Specific Gravity, Urine 1.025 1.005 - 1.030   Hgb urine dipstick TRACE (A) NEGATIVE   pH 6.5 5.0 - 8.0   Protein, ur NEGATIVE NEGATIVE mg/dL   Urobilinogen, UA 0.2 0.0 - 1.0 mg/dL   Nitrite NEGATIVE NEGATIVE   Leukocytes,Ua SMALL (A) NEGATIVE  POC urine preg, ED (not at [facility])     Status: None   Collection Time: 05/31/20  4:37 PM  Result Value Ref Range   Preg Test, Ur NEGATIVE NEGATIVE  Cervicovaginal ancillary only     Status: Abnormal   Collection Time: 05/31/20  5:23 PM  Result Value Ref Range   Neisseria Gonorrhea Negative    Chlamydia Negative    Trichomonas Negative    Bacterial Vaginitis (gardnerella) Positive (A)    Candida Vaginitis Positive (A)    Candida Glabrata Negative    Comment Normal Reference Range Candida Species - Negative    Comment Normal Reference Range Candida Galbrata - Negative    Comment Normal Reference Range Trichomonas - Negative    Comment      Normal Reference Range Bacterial Vaginosis - Negative   Comment Normal Reference [place] Chlamydia - Negative    Comment      Normal Reference Range Neisseria Gonorrhea - Negative   No results found.  ED Clinical  Impression  1. Acute cystitis with hematuria   2. Vitreous floaters of left eye   3. Screening examination for STD (sexually transmitted disease)   4. BV (bacterial vaginosis)   5. Vaginal yeast infection      ED Assessment/Plan   1.  Urinary symptoms.  Urine pregnancy negative urine dip has small leukocytes and trace hematuria.  Given her symptoms, physical exam, will treat for UTI.  No evidence of pyelonephritis today.  Home with Keflex, patient states this usually works well for her, Pyridium.  Urine culture sent to confirm diagnosis and antibiotic choice.  Urine culture March 2021 grew out E. coli that was [place].  2.  Eye floater-her visual acuity is excellent, no dual field deficits.  no corneal abrasion, hyphema or foreign body.  Suspect vitreous floater.  will have her follow-up with [facility] if not better in several days.  3.  STD testing.  Gonorrhea, chlamydia, trichomonas, BV, yeast sent.  Will treat based on labs.  Patient has BV and yeast.  We will E prescribe Flagyl and Diflucan to pharmacy on record.  Will have staff notify patient of prescriptions.  Primary care provider list for ongoing care.  Discussed labs,MDM, treatment plan, and plan for follow-up with patient. Discussed sn/sx that should prompt return to the ED. patient agrees with plan.   Meds ordered this encounter  Medications  . phenazopyridine (PYRIDIUM) 200 MG tablet    Sig: Take 1 tablet (200 mg total) by mouth 3 (three) times daily as needed for pain.    Dispense:  6 tablet    Refill:  0  . cephALEXin (KEFLEX) 500 MG capsule    Sig: Take 1 capsule (500 mg total) by mouth 2 (two) times daily.    Dispense:  14 capsule    Refill:  0    *This clinic note was created using Scientist, clinical (histocompatibility and immunogenetics). Therefore, there may be occasional mistakes despite careful proofreading.   ?    Domenick Gong, MD 06/01/20 1209    Domenick Gong, MD 06/01/20 1210

## 2020-05-31 NOTE — ED Triage Notes (Signed)
Pt c/o "black spot in my vision on the left side" x 2-3 days.  Also c/o urinary frequency x 2 weeks

## 2020-05-31 NOTE — Discharge Instructions (Signed)
Urine is suggestive of urinary tract infection.  Finish the Keflex, even if you feel better.  Pyridium will help with your symptoms.  Urine pregnancy was negative.  I have sent your urine off for culture to make sure that we have you on the right antibiotic.  Your STD testing will be back in several days.  We will base treatment off of those labs.  Follow-up with Dr. Wynelle Link, ophthalmology if your eyes not better in several days.  Below is a list of primary care practices who are taking new patients for you to follow-up with.  [facility] internal medicine clinic Ground Floor - [facility], [address] [place], [place], [place] [address] [phone]  [facility] [address] [place], [place] [address] [phone]  [facility] [address] [place], [place] [address] [phone]  [facility] [phone] [address] [place] [address]  [facility]: [address] [place] [address]  [phone]  [facility]: [address] [place] [address]   [phone]  [facility]: [address] [place] [address]  [phone]  [facility] : [address] [place] [address] [phone]  [facility]: [address] [place] [address] [phone]  [facility]: [address] [place] [address] [phone]  Dr. Oneal Grout [address] [facility] [place] [address]  [phone]  Dr. Jackie Plum, [facility]. [address] [place], [place] [address]  [phone]  Go to www.goodrx.com to look up your medications. This will give you a list of where you can find your prescriptions at  the most affordable prices. Or ask the pharmacist what the cash price is, or if they have any other discount programs available to help make your medication more affordable. This can be less expensive than what you would pay with insurance.

## 2020-06-01 ENCOUNTER — Telehealth (HOSPITAL_COMMUNITY): Payer: Self-pay | Admitting: Emergency Medicine

## 2020-06-01 DIAGNOSIS — B3731 Acute candidiasis of vulva and vagina: Secondary | ICD-10-CM

## 2020-06-01 DIAGNOSIS — N760 Acute vaginitis: Secondary | ICD-10-CM

## 2020-06-01 LAB — CERVICOVAGINAL ANCILLARY ONLY
Bacterial Vaginitis (gardnerella): POSITIVE — AB
Candida Glabrata: NEGATIVE
Candida Vaginitis: POSITIVE — AB
Chlamydia: NEGATIVE
Comment: NEGATIVE
Comment: NEGATIVE
Comment: NEGATIVE
Comment: NEGATIVE
Comment: NEGATIVE
Comment: NORMAL
Neisseria Gonorrhea: NEGATIVE
Trichomonas: NEGATIVE

## 2020-06-01 MED ORDER — FLUCONAZOLE 150 MG PO TABS: 150.0000 mg | ORAL_TABLET | Freq: Once | ORAL | 1 refills | Status: AC

## 2020-06-01 MED ORDER — METRONIDAZOLE 500 MG PO TABS: 500.0000 mg | ORAL_TABLET | Freq: Two times a day (BID) | ORAL | 0 refills | Status: AC

## 2020-06-01 NOTE — Telephone Encounter (Signed)
Patient's wet prep back, positive for BV and yeast.  E prescribing Flagyl and Diflucan to the pharmacy on record.  Please contact her and notify her of results. AM

## 2020-06-02 LAB — URINE CULTURE: Culture: >=100000 — AB

## 2020-06-09 ENCOUNTER — Encounter (HOSPITAL_COMMUNITY): Payer: Self-pay | Admitting: *Deleted

## 2020-06-09 ENCOUNTER — Emergency Department (HOSPITAL_COMMUNITY)
Admission: EM | Admit: 2020-06-09 | Discharge: 2020-06-09 | Disposition: A | Discharge status: Home / Self Care | Attending: Emergency Medicine | Admitting: Emergency Medicine

## 2020-06-09 ENCOUNTER — Emergency Department (HOSPITAL_COMMUNITY)

## 2020-06-09 DIAGNOSIS — K2900 Acute gastritis without bleeding: Secondary | ICD-10-CM | POA: Diagnosis not present

## 2020-06-09 DIAGNOSIS — R1110 Vomiting, unspecified: Secondary | ICD-10-CM

## 2020-06-09 LAB — CBC
HCT: 39.2 % (ref 36.0–46.0)
Hemoglobin: 12.9 g/dL (ref 12.0–15.0)
MCH: 29.4 pg (ref 26.0–34.0)
MCHC: 32.9 g/dL (ref 30.0–36.0)
MCV: 89.3 fL (ref 80.0–100.0)
Platelets: 291 10*3/uL (ref 150–400)
RBC: 4.39 MIL/uL (ref 3.87–5.11)
RDW: 12.2 % (ref 11.5–15.5)
WBC: 6.8 10*3/uL (ref 4.0–10.5)
nRBC: 0 % (ref 0.0–0.2)

## 2020-06-09 LAB — URINALYSIS, ROUTINE W REFLEX MICROSCOPIC
Bilirubin Urine: NEGATIVE
Glucose, UA: NEGATIVE mg/dL
Hgb urine dipstick: NEGATIVE
Ketones, ur: 20 mg/dL — AB
Nitrite: NEGATIVE
Protein, ur: NEGATIVE mg/dL
Specific Gravity, Urine: 1.026 (ref 1.005–1.030)
pH: 6 (ref 5.0–8.0)

## 2020-06-09 LAB — COMPREHENSIVE METABOLIC PANEL
ALT: 15 U/L (ref 0–44)
AST: 21 U/L (ref 15–41)
Albumin: 3.9 g/dL (ref 3.5–5.0)
Alkaline Phosphatase: 60 U/L (ref 38–126)
Anion gap: 11 (ref 5–15)
BUN: 12 mg/dL (ref 6–20)
CO2: 22 mmol/L (ref 22–32)
Calcium: 9.1 mg/dL (ref 8.9–10.3)
Chloride: 104 mmol/L (ref 98–111)
Creatinine, Ser: 0.8 mg/dL (ref 0.44–1.00)
GFR calc Af Amer: >60 mL/min (ref >60)
GFR calc non Af Amer: >60 mL/min (ref >60)
Glucose, Bld: 110 mg/dL — ABNORMAL HIGH (ref 70–99)
Potassium: 3.6 mmol/L (ref 3.5–5.1)
Sodium: 137 mmol/L (ref 135–145)
Total Bilirubin: 1 mg/dL (ref 0.3–1.2)
Total Protein: 7 g/dL (ref 6.5–8.1)

## 2020-06-09 LAB — I-STAT BETA HCG BLOOD, ED (MC, WL, AP ONLY): I-stat hCG, quantitative: <5 m[IU]/mL (ref <5)

## 2020-06-09 LAB — LIPASE, BLOOD: Lipase: 28 U/L (ref 11–51)

## 2020-06-09 MED ORDER — ONDANSETRON 4 MG PO TBDP: 4.0000 mg | ORAL_TABLET | Freq: Three times a day (TID) | ORAL | 0 refills | Status: DC | PRN

## 2020-06-09 MED ORDER — ALUM & MAG HYDROXIDE-SIMETH 200-200-20 MG/5ML PO SUSP
30.0000 mL | Freq: Once | ORAL | Status: AC
  Administered 2020-06-09: 30 mL via ORAL
  Filled 2020-06-09: qty 30

## 2020-06-09 MED ORDER — ONDANSETRON HCL 4 MG/2ML IJ SOLN
4.0000 mg | Freq: Once | INTRAMUSCULAR | Status: AC
  Administered 2020-06-09: 4 mg via INTRAVENOUS
  Filled 2020-06-09: qty 2

## 2020-06-09 MED ORDER — PANTOPRAZOLE SODIUM 40 MG PO TBEC: 40.0000 mg | DELAYED_RELEASE_TABLET | Freq: Every day | ORAL | 0 refills | Status: DC

## 2020-06-09 MED ORDER — LIDOCAINE VISCOUS HCL 2 % MT SOLN
15.0000 mL | Freq: Once | OROMUCOSAL | Status: AC
  Administered 2020-06-09: 15 mL via ORAL
  Filled 2020-06-09: qty 15

## 2020-06-09 MED ORDER — SODIUM CHLORIDE 0.9 % IV BOLUS
1000.0000 mL | Freq: Once | INTRAVENOUS | Status: AC
  Administered 2020-06-09: 1000 mL via INTRAVENOUS

## 2020-06-09 NOTE — ED Notes (Signed)
PT acknowledges drinking alcohol last night while on flagyl. She states she did not know it was contraindicated.

## 2020-06-09 NOTE — Discharge Instructions (Signed)
If you develop worsening, continued, or recurrent abdominal pain, uncontrolled vomiting, fever, chest or back pain, or any other new/concerning symptoms then return to the ER for evaluation.   Do NOT use alcohol while you are on the Flagyl/Metronidazole.

## 2020-06-09 NOTE — ED Provider Notes (Signed)
[facility] EMERGENCY DEPARTMENT Provider Note   CSN: [id number] Arrival date & time: 06/09/20  0557     History Chief Complaint  Patient presents with  . Chest Pain    Lindsey Castro is a 19 y.o. female.  HPI 19 year old female presents with vomiting and abdominal/chest pain.  Started last night.  She had a few alcoholic drinks but states it was not that much.  A couple shots in a four loco.  Since then she is had numerous episodes of emesis without blood.  Mostly is yellow.  She has had some inferior chest burning and epigastric pain.  She is currently on an antidepressant but is also on Keflex and metronidazole for a urinary tract infection.  No diarrhea.   Past Medical History:  Diagnosis Date  . Anxiety   . Depression     There are no problems to display for this patient.   History reviewed. No pertinent surgical history.   OB History   No obstetric history on file.     Family History  Problem Relation Age of Onset  . Healthy Mother   . Healthy Father     Social History   Tobacco Use  . Smoking status: Never Smoker  . Smokeless tobacco: Never Used  Substance Use Topics  . Alcohol use: Yes  . Drug use: Never    Home Medications Prior to Admission medications   Medication Sig Start Date End Date Taking? Authorizing Provider  busPIRone (BUSPAR) 5 MG tablet Take 10 mg by mouth daily as needed for anxiety. 09/09/19   [provider]  cephALEXin (KEFLEX) 500 MG capsule Take 1 capsule (500 mg total) by mouth 2 (two) times daily. 05/31/20   Domenick Gong, MD  FLUoxetine (PROZAC) 20 MG capsule Take 20 mg by mouth daily. 12/04/19   [provider]  ondansetron (ZOFRAN ODT) 4 MG disintegrating tablet Take 1 tablet (4 mg total) by mouth every 8 (eight) hours as needed for nausea or vomiting. 06/09/20   Pricilla Loveless, MD  pantoprazole (PROTONIX) 40 MG tablet Take 1 tablet (40 mg total) by mouth daily. 06/09/20   Pricilla Loveless, MD    phenazopyridine (PYRIDIUM) 200 MG tablet Take 1 tablet (200 mg total) by mouth 3 (three) times daily as needed for pain. 05/31/20   Domenick Gong, MD    Allergies    Patient has no known allergies.  Review of Systems   Review of Systems  Cardiovascular: Positive for chest pain.  Gastrointestinal: Positive for abdominal pain and vomiting. Negative for diarrhea.  All other systems reviewed and are negative.   Physical Exam Updated Vital Signs BP 116/67 (BP Location: Left Arm)   Pulse 96   Temp 98.6 F (37 C) (Oral)   Resp 16   LMP 05/22/2020   SpO2 98%   Physical Exam Vitals and nursing note reviewed.  Constitutional:      General: She is not in acute distress.    Appearance: She is well-developed. She is not ill-appearing or diaphoretic.  HENT:     Head: Normocephalic and atraumatic.     Right Ear: External ear normal.     Left Ear: External ear normal.     Nose: Nose normal.  Eyes:     General:        Right eye: No discharge.        Left eye: No discharge.  Cardiovascular:     Rate and Rhythm: Normal rate and regular rhythm.  Heart sounds: Normal heart sounds.  Pulmonary:     Effort: Pulmonary effort is normal.     Breath sounds: Normal breath sounds.  Abdominal:     Palpations: Abdomen is soft.     Tenderness: There is abdominal tenderness in the right upper quadrant, epigastric area and left upper quadrant.  Skin:    General: Skin is warm and dry.  Neurological:     Mental Status: She is alert.  Psychiatric:        Mood and Affect: Mood is not anxious.     ED Results / Procedures / Treatments   Labs (all labs ordered are listed, but only abnormal results are displayed) Labs Reviewed  COMPREHENSIVE METABOLIC PANEL - Abnormal; Notable for the following components:      Result Value   Glucose, Bld 110 (*)    All other components within normal limits  URINALYSIS, ROUTINE W REFLEX MICROSCOPIC - Abnormal; Notable for the following components:    Color, Urine AMBER (*)    APPearance HAZY (*)    Ketones, ur 20 (*)    Leukocytes,Ua MODERATE (*)    Bacteria, UA RARE (*)    Non Squamous Epithelial 0-5 (*)    All other components within normal limits  LIPASE, BLOOD  CBC  I-STAT BETA HCG BLOOD, ED ([facility], [facility], [facility] ONLY)    EKG EKG Interpretation  Date/Time:  Friday June 09 2020 09:04:47 EDT Ventricular Rate:  81 PR Interval:    QRS Duration: 77 QT Interval:  397 QTC Calculation: 461 R Axis:   79 Text Interpretation: Sinus rhythm no acute ST/T changes No old tracing to compare Confirmed by Pricilla Loveless [phone]) on 06/09/2020 9:07:30 AM   Radiology DG Chest Portable 1 View  Result Date: 06/09/2020 CLINICAL DATA:  Acute chest pain. EXAM: PORTABLE CHEST 1 VIEW COMPARISON:  None. FINDINGS: The cardiomediastinal silhouette is unremarkable. There is no evidence of focal airspace disease, pulmonary edema, suspicious pulmonary nodule/mass, pleural effusion, or pneumothorax. No acute bony abnormalities are identified. IMPRESSION: No active disease. Electronically Signed   By: Harmon Pier M.D.   On: 06/09/2020 09:01    Procedures Procedures (including critical care time)  Medications Ordered in ED Medications  alum & mag hydroxide-simeth (MAALOX/MYLANTA) 200-200-20 MG/5ML suspension 30 mL (30 mLs Oral Given 06/09/20 0906)    And  lidocaine (XYLOCAINE) 2 % viscous mouth solution 15 mL (15 mLs Oral Given 06/09/20 0906)  ondansetron (ZOFRAN) injection 4 mg (4 mg Intravenous Given 06/09/20 0906)  sodium chloride 0.9 % bolus 1,000 mL (1,000 mLs Intravenous New Bag/Given 06/09/20 0907)    ED Course  I have reviewed the triage vital signs and the nursing notes.  Pertinent labs & imaging results that were available during my care of the patient were reviewed by me and considered in my medical decision making (see chart for details).    MDM Rules/Calculators/A&P                          Patient still vomiting is likely from drinking alcohol  while on metronidazole.  She has some upper abdominal discomfort but a benign abdominal exam.  Labs have been reviewed and are reassuring.  ECG and chest x-ray also reviewed and are benign.  At this point she is feeling better with treatment and appears stable for discharge home. UA is contaminated, and she is already on antibiotics. Doubt pyelonephritis or treatment failure Final Clinical Impression(s) / ED Diagnoses Final diagnoses:  Vomiting in adult  Acute gastritis without hemorrhage, unspecified gastritis type    Rx / DC Orders ED Discharge Orders         Ordered    pantoprazole (PROTONIX) 40 MG tablet  Daily        06/09/20 0933    ondansetron (ZOFRAN ODT) 4 MG disintegrating tablet  Every 8 hours PRN        06/09/20 3559           Pricilla Loveless, MD 06/09/20 [phone]

## 2020-06-09 NOTE — ED Triage Notes (Signed)
To ED for eval of vomiting since around 2am today. Also complains of burning in epigastric area. States states was drinking alcohol prior to this started. No diarrhea. Appears in nad

## 2021-01-02 ENCOUNTER — Other Ambulatory Visit: Payer: Self-pay

## 2021-01-02 ENCOUNTER — Ambulatory Visit (INDEPENDENT_AMBULATORY_CARE_PROVIDER_SITE_OTHER)

## 2021-01-02 ENCOUNTER — Ambulatory Visit (HOSPITAL_COMMUNITY)
Admission: EM | Admit: 2021-01-02 | Discharge: 2021-01-02 | Disposition: A | Discharge status: Home / Self Care | Attending: Student | Admitting: Student

## 2021-01-02 ENCOUNTER — Encounter (HOSPITAL_COMMUNITY): Payer: Self-pay | Admitting: *Deleted

## 2021-01-02 DIAGNOSIS — M25561 Pain in right knee: Secondary | ICD-10-CM | POA: Diagnosis not present

## 2021-01-02 DIAGNOSIS — M25461 Effusion, right knee: Secondary | ICD-10-CM | POA: Diagnosis not present

## 2021-01-02 DIAGNOSIS — S8991XA Unspecified injury of right lower leg, initial encounter: Secondary | ICD-10-CM | POA: Diagnosis not present

## 2021-01-02 DIAGNOSIS — W19XXXA Unspecified fall, initial encounter: Secondary | ICD-10-CM

## 2021-01-02 MED ORDER — NAPROXEN 500 MG PO TABS: 500.0000 mg | ORAL_TABLET | Freq: Two times a day (BID) | ORAL | 0 refills | Status: DC

## 2021-01-02 NOTE — ED Triage Notes (Signed)
PT reports Rt leg injury yesterday while playing Soccer.

## 2021-01-02 NOTE — ED Provider Notes (Signed)
[facility]    CSN: [id number] Arrival date & time: 01/02/21  1448      History   Chief Complaint Chief Complaint  Patient presents with  . Leg Injury    HPI Lindsey Castro is a 20 y.o. female presenting with R knee injury.  Medical history noncontributory.  States that she was playing soccer 1 day ago when she fell onto her right knee, rolling onto the outer aspect.  She heard a pop that seemed to come from the side of her knee, but no immediate pain.  She was able to walk without pain immediately afterwards.  However she woke up this morning with pain and swelling of the knee.  She is able to walk but with significant pain  Some decreased sensation in R lower extremity.  Denies injury elsewhere.  Denies head trauma, loss of consciousness, headaches.  Denies dizziness before or after the injury.  HPI  Past Medical History:  Diagnosis Date  . Anxiety   . Depression     There are no problems to display for this patient.   History reviewed. No pertinent surgical history.  OB History   No obstetric history on file.      Home Medications    Prior to Admission medications   Medication Sig Start Date End Date Taking? Authorizing Provider  naproxen (NAPROSYN) 500 MG tablet Take 1 tablet (500 mg total) by mouth 2 (two) times daily. 01/02/21  Yes Rhys Martini, PA-C  busPIRone (BUSPAR) 5 MG tablet Take 10 mg by mouth daily as needed for anxiety. 09/09/19   [provider]  cephALEXin (KEFLEX) 500 MG capsule Take 1 capsule (500 mg total) by mouth 2 (two) times daily. 05/31/20   Domenick Gong, MD  FLUoxetine (PROZAC) 20 MG capsule Take 20 mg by mouth daily. 12/04/19   [provider]  ondansetron (ZOFRAN ODT) 4 MG disintegrating tablet Take 1 tablet (4 mg total) by mouth every 8 (eight) hours as needed for nausea or vomiting. 06/09/20   Pricilla Loveless, MD  pantoprazole (PROTONIX) 40 MG tablet Take 1 tablet (40 mg total) by mouth daily. 06/09/20    Pricilla Loveless, MD  phenazopyridine (PYRIDIUM) 200 MG tablet Take 1 tablet (200 mg total) by mouth 3 (three) times daily as needed for pain. 05/31/20   Domenick Gong, MD    Family History Family History  Problem Relation Age of Onset  . Healthy Mother   . Healthy Father     Social History Social History   Tobacco Use  . Smoking status: Never Smoker  . Smokeless tobacco: Never Used  Substance Use Topics  . Alcohol use: Yes  . Drug use: Never     Allergies   Patient has no known allergies.   Review of Systems Review of Systems  Musculoskeletal:       R knee pain  All other systems reviewed and are negative.    Physical Exam Triage Vital Signs ED Triage Vitals  Enc Vitals Group     BP 01/02/21 1525 96/65     Pulse Rate 01/02/21 1525 90     Resp 01/02/21 1525 18     Temp 01/02/21 1525 99.3 F (37.4 C)     Temp Source 01/02/21 1525 Oral     SpO2 01/02/21 1525 98 %     Weight --      Height --      Head Circumference --      Peak Flow --  Pain Score 01/02/21 1523 8     Pain Loc --      Pain Edu? --      Excl. in GC? --    No data found.  Updated Vital Signs BP 96/65 (BP Location: Left Arm)   Pulse 90   Temp 99.3 F (37.4 C) (Oral)   Resp 18   LMP 01/02/2021   SpO2 98%   Visual Acuity Right Eye Distance:   Left Eye Distance:   Bilateral Distance:    Right Eye Near:   Left Eye Near:    Bilateral Near:     Physical Exam Vitals reviewed.  Constitutional:      Appearance: Normal appearance.  HENT:     Head: Normocephalic and atraumatic.  Cardiovascular:     Rate and Rhythm: Normal rate.  Pulmonary:     Effort: Pulmonary effort is normal.  Musculoskeletal:     Comments: R knee with effusion, lateral > medial. Faint ecchymosis overlying patellar tendon insertion. Lateral collateral ligament laxity. Pain elicited with flexion and extension. Exam limited due to patient discomfort. Neurovascularly intact. DP 2+, ROM ankle and toes intact.   Skin:    Capillary Refill: Capillary refill takes less than 2 seconds.  Neurological:     General: No focal deficit present.     Mental Status: She is alert and oriented to person, place, and time.  Psychiatric:        Mood and Affect: Mood normal.        Behavior: Behavior normal.        Thought Content: Thought content normal.        Judgment: Judgment normal.      UC Treatments / Results  Labs (all labs ordered are listed, but only abnormal results are displayed) Labs Reviewed - No data to display  EKG   Radiology DG Knee Complete 4 Views Right  Result Date: 01/02/2021 CLINICAL DATA:  Lateral joint line tenderness and effusion after fall EXAM: RIGHT KNEE - COMPLETE 4+ VIEW COMPARISON:  None. FINDINGS: Frontal, bilateral oblique, lateral views of the right knee are obtained. No fracture, subluxation, or dislocation. Joint spaces are well preserved. Soft tissues are unremarkable. No joint effusion. IMPRESSION: 1. Unremarkable right knee. Electronically Signed   By: Sharlet Salina M.D.   On: 01/02/2021 16:23    Procedures Procedures (including critical care time)  Medications Ordered in UC Medications - No data to display  Initial Impression / Assessment and Plan / UC Course  I have reviewed the triage vital signs and the nursing notes.  Pertinent labs & imaging results that were available during my care of the patient were reviewed by me and considered in my medical decision making (see chart for details).     This patient is a 20 year old female presenting with right knee pain following fall.  On exam, lateral collateral ligament laxity. Neurovascularly intact.  Xray R knee- unremarkable. Films interpreted by myself and radiologist.  Knee brace, crutches. F/u with ortho. Information provided.   This chart was dictated using voice recognition software, Dragon. Despite the best efforts of this provider to proofread and correct errors, errors may still occur which  can change documentation meaning.  Final Clinical Impressions(s) / UC Diagnoses   Final diagnoses:  Injury of right knee, initial encounter  Fall, initial encounter     Discharge Instructions     -Keep your knee brace on at all times while walking until you follow-up with orthopedist. -Also use your crutches while  walking. -Naproxen and tylenol for pain.  Also try resting the knee and elevating your leg at the end of the day. Avoid other nsaids while taking naproxen, like ibuprofen and alleve. -If your pain gets significantly better, you can try stopping the crutches and/or knee brace. But make sure to use these while you're still having pain!    ED Prescriptions    Medication Sig Dispense Auth. Provider   naproxen (NAPROSYN) 500 MG tablet Take 1 tablet (500 mg total) by mouth 2 (two) times daily. 30 tablet Rhys Martini, PA-C     PDMP not reviewed this encounter.   Rhys Martini, PA-C 01/02/21 1658

## 2021-01-02 NOTE — Discharge Instructions (Addendum)
-  Keep your knee brace on at all times while walking until you follow-up with orthopedist. -Also use your crutches while walking. -Naproxen and tylenol for pain.  Also try resting the knee and elevating your leg at the end of the day. Avoid other nsaids while taking naproxen, like ibuprofen and alleve. -If your pain gets significantly better, you can try stopping the crutches and/or knee brace. But make sure to use these while you're still having pain!

## 2021-01-26 ENCOUNTER — Other Ambulatory Visit: Payer: Self-pay

## 2021-01-26 ENCOUNTER — Ambulatory Visit (HOSPITAL_COMMUNITY)
Admission: EM | Admit: 2021-01-26 | Discharge: 2021-01-26 | Disposition: A | Discharge status: Home / Self Care | Attending: Internal Medicine | Admitting: Internal Medicine

## 2021-01-26 ENCOUNTER — Encounter (HOSPITAL_COMMUNITY): Payer: Self-pay | Admitting: Emergency Medicine

## 2021-01-26 DIAGNOSIS — M545 Low back pain, unspecified: Secondary | ICD-10-CM | POA: Diagnosis present

## 2021-01-26 DIAGNOSIS — R8290 Unspecified abnormal findings in urine: Secondary | ICD-10-CM | POA: Insufficient documentation

## 2021-01-26 DIAGNOSIS — R6883 Chills (without fever): Secondary | ICD-10-CM | POA: Diagnosis not present

## 2021-01-26 LAB — POCT URINALYSIS DIPSTICK, ED / UC
Bilirubin Urine: NEGATIVE
Glucose, UA: NEGATIVE mg/dL
Nitrite: NEGATIVE
Protein, ur: NEGATIVE mg/dL
Specific Gravity, Urine: 1.025 (ref 1.005–1.030)
Urobilinogen, UA: 0.2 mg/dL (ref 0.0–1.0)
pH: 5.5 (ref 5.0–8.0)

## 2021-01-26 MED ORDER — NITROFURANTOIN MONOHYD MACRO 100 MG PO CAPS: 100.0000 mg | ORAL_CAPSULE | Freq: Two times a day (BID) | ORAL | 0 refills | Status: DC

## 2021-01-26 NOTE — ED Provider Notes (Addendum)
[facility]    CSN: [id number] Arrival date & time: 01/26/21  1640      History   Chief Complaint Chief Complaint  Patient presents with  . Fever  . Chills  . Back Pain    HPI Lindsey Castro is a 20 y.o. female.   Patient presents today with 1 day history of fever and chills.  She reports that somewhat lower back pain making her think she might have a urinary tract infection.  She does have a history of UTI and nephrolithiasis.  Denies any dysuria, nausea, vomiting, abdominal pain, vaginal complaints, cough, congestion.  She denies any recent antibiotic use.  She has not tried any over-the-counter medications for symptom management.  She reports current symptoms are not similar to previous episodes of nephrolithiasis.  Denies recent urogenital procedure, single kidney, self-catheterization.  She denies concern for STI and declines testing today.     Past Medical History:  Diagnosis Date  . Anxiety   . Depression     There are no problems to display for this patient.   History reviewed. No pertinent surgical history.  OB History   No obstetric history on file.      Home Medications    Prior to Admission medications   Medication Sig Start Date End Date Taking? Authorizing Provider  nitrofurantoin, macrocrystal-monohydrate, (MACROBID) 100 MG capsule Take 1 capsule (100 mg total) by mouth 2 (two) times daily. 01/26/21  Yes Andra Heslin K, PA-C  busPIRone (BUSPAR) 5 MG tablet Take 10 mg by mouth daily as needed for anxiety. 09/09/19   [provider]  FLUoxetine (PROZAC) 20 MG capsule Take 20 mg by mouth daily. 12/04/19   [provider]  naproxen (NAPROSYN) 500 MG tablet Take 1 tablet (500 mg total) by mouth 2 (two) times daily. 01/02/21   Rhys Martini, PA-C  ondansetron (ZOFRAN ODT) 4 MG disintegrating tablet Take 1 tablet (4 mg total) by mouth every 8 (eight) hours as needed for nausea or vomiting. 06/09/20   Pricilla Loveless, MD   pantoprazole (PROTONIX) 40 MG tablet Take 1 tablet (40 mg total) by mouth daily. 06/09/20   Pricilla Loveless, MD  phenazopyridine (PYRIDIUM) 200 MG tablet Take 1 tablet (200 mg total) by mouth 3 (three) times daily as needed for pain. 05/31/20   Domenick Gong, MD    Family History Family History  Problem Relation Age of Onset  . Healthy Mother   . Healthy Father     Social History Social History   Tobacco Use  . Smoking status: Never Smoker  . Smokeless tobacco: Never Used  Substance Use Topics  . Alcohol use: Yes  . Drug use: Never     Allergies   Patient has no known allergies.   Review of Systems Review of Systems  Constitutional: Positive for fatigue and fever. Negative for activity change and appetite change.  Respiratory: Negative for cough and shortness of breath.   Cardiovascular: Negative for chest pain.  Gastrointestinal: Positive for abdominal pain. Negative for diarrhea, nausea and vomiting.  Musculoskeletal: Negative for arthralgias and myalgias.  Neurological: Positive for headaches. Negative for dizziness and light-headedness.     Physical Exam Triage Vital Signs ED Triage Vitals  Enc Vitals Group     BP 01/26/21 1718 (!) 98/55     Pulse Rate 01/26/21 1718 86     Resp 01/26/21 1718 15     Temp 01/26/21 1718 98.3 F (36.8 C)     Temp Source 01/26/21 1718  Oral     SpO2 01/26/21 1718 100 %     Weight --      Height --      Head Circumference --      Peak Flow --      Pain Score 01/26/21 1716 2     Pain Loc --      Pain Edu? --      Excl. in GC? --    No data found.  Updated Vital Signs BP (!) 98/55 (BP Location: Right Arm)   Pulse 86   Temp 98.3 F (36.8 C) (Oral)   Resp 15   LMP 12/30/2020   SpO2 100%   Visual Acuity Right Eye Distance:   Left Eye Distance:   Bilateral Distance:    Right Eye Near:   Left Eye Near:    Bilateral Near:     Physical Exam Vitals reviewed.  Constitutional:      General: She is awake. She is not  in acute distress.    Appearance: Normal appearance. She is not ill-appearing.     Comments: Very pleasant female appears stated age in no acute distress  HENT:     Head: Normocephalic and atraumatic.  Cardiovascular:     Rate and Rhythm: Normal rate and regular rhythm.     Heart sounds: No murmur heard.   Pulmonary:     Effort: Pulmonary effort is normal.     Breath sounds: Normal breath sounds. No wheezing, rhonchi or rales.     Comments: Clear to auscultation bilaterally Abdominal:     General: Bowel sounds are normal.     Palpations: Abdomen is soft.     Tenderness: There is no abdominal tenderness. There is no right CVA tenderness, left CVA tenderness, guarding or rebound.     Comments: Benign abdominal exam; no tenderness palpation.  No CVA tenderness.  Psychiatric:        Behavior: Behavior is cooperative.      UC Treatments / Results  Labs (all labs ordered are listed, but only abnormal results are displayed) Labs Reviewed  POCT URINALYSIS DIPSTICK, ED / UC - Abnormal; Notable for the following components:      Result Value   Ketones, ur TRACE (*)    Hgb urine dipstick TRACE (*)    Leukocytes,Ua SMALL (*)    All other components within normal limits  URINE CULTURE    EKG   Radiology No results found.  Procedures Procedures (including critical care time)  Medications Ordered in UC Medications - No data to display  Initial Impression / Assessment and Plan / UC Course  I have reviewed the triage vital signs and the nursing notes.  Pertinent labs & imaging results that were available during my care of the patient were reviewed by me and considered in my medical decision making (see chart for details).     UA shows some evidence of infection.  Will empirically treat with Macrobid 100 mg twice daily for 5 days.  Patient denies any concern for pregnancy.  Urine culture obtained today-results pending peer discussed potential need to change antibiotics based on  susceptibilities identified on culture.  Recommended she rest and drink plenty of fluid.  She was provided a work excuse note.  Blood pressure was slightly soft today but this is trend for patient and unchanged from baseline.  Strict return precautions given to which patient expressed understanding.  Final Clinical Impressions(s) / UC Diagnoses   Final diagnoses:  Chills  Acute bilateral  low back pain without sciatica  Malodorous urine     Discharge Instructions     Take antibiotic twice daily for 5 days.  We will be in touch with your urine culture results and let you know if we need to change her antibiotic based on these results.  Please drink plenty of fluid.  If you have any worsening symptoms you need to be reevaluated.    ED Prescriptions    Medication Sig Dispense Auth. Provider   nitrofurantoin, macrocrystal-monohydrate, (MACROBID) 100 MG capsule Take 1 capsule (100 mg total) by mouth 2 (two) times daily. 10 capsule Judiann Celia, Noberto Retort, PA-C     PDMP not reviewed this encounter.   Jeani Hawking, PA-C 01/26/21 1801    Enrique Manganaro, Noberto Retort, PA-C 01/26/21 1801

## 2021-01-26 NOTE — Discharge Instructions (Addendum)
Take antibiotic twice daily for 5 days.  We will be in touch with your urine culture results and let you know if we need to change her antibiotic based on these results.  Please drink plenty of fluid.  If you have any worsening symptoms you need to be reevaluated.

## 2021-01-26 NOTE — ED Triage Notes (Signed)
Pt presents with fever, chills, lower back pain, headache, and neck pain that started this am. States has hx of bilateral kidney stones.

## 2021-01-28 LAB — URINE CULTURE

## 2021-02-02 ENCOUNTER — Other Ambulatory Visit: Payer: Self-pay

## 2021-02-02 ENCOUNTER — Ambulatory Visit (INDEPENDENT_AMBULATORY_CARE_PROVIDER_SITE_OTHER): Admitting: Medical

## 2021-02-02 ENCOUNTER — Encounter: Payer: Self-pay | Admitting: Medical

## 2021-02-02 VITALS — BP 92/60 | HR 90 | Ht 64.0 in | Wt 139.2 lb

## 2021-02-02 DIAGNOSIS — N390 Urinary tract infection, site not specified: Secondary | ICD-10-CM

## 2021-02-02 DIAGNOSIS — M25561 Pain in right knee: Secondary | ICD-10-CM | POA: Diagnosis not present

## 2021-02-02 DIAGNOSIS — S8990XA Unspecified injury of unspecified lower leg, initial encounter: Secondary | ICD-10-CM | POA: Diagnosis not present

## 2021-02-02 DIAGNOSIS — R7309 Other abnormal glucose: Secondary | ICD-10-CM | POA: Insufficient documentation

## 2021-02-02 DIAGNOSIS — N200 Calculus of kidney: Secondary | ICD-10-CM | POA: Insufficient documentation

## 2021-02-02 LAB — POCT CBG (FASTING - GLUCOSE)-MANUAL ENTRY: Glucose Fasting, POC: 100 mg/dL — AB (ref 70–99)

## 2021-02-02 NOTE — Addendum Note (Signed)
Addended by: Jac Canavan on: 02/02/2021 01:05 PM   Modules accepted: Orders

## 2021-02-02 NOTE — Progress Notes (Addendum)
Subjective: Chief Complaint  Patient presents with  . New Patient (Initial Visit)    Get established and discuss knee pain  . Knee Pain    Right knee injury 1 month ago playing soccer    Here as a new patient to establish care.  Last PCP in [place].  She is here in school at [employer], [employer] major.  Been having some knee pain.  Sometimes it will flare up and be sore for hours.  Was playing soccer and fell on her knee in mid march, around 12/19/20.    Right knee.  Initially had swelling and bruising.  Still tender to touch at times.   Pain has been intermittent.  Been wearing an OTC knee brace since the injury.  Did some ice and NSAIDs initially.  Walks for exercise in general.   Been using knee sleeve.   Initially went to [facility] urgent care the day after the injury, had a normal x-ray reportedly.  She actually went to [facility] in [place] within the past 2 weeks.  They recommended MRI but she was going to establish back care first.  She continues to have pain regularly, some swelling.  No numbness or tingling.  She has a history of frequent urinary tract infection within the past year.  She probably had treatment maybe 3-4 times this past year for UTI.  She just finished Bactrim antibiotic yesterday for recurrent UTI.  Last week she saw urgent care after having cloudy urine, back pain, fever body aches and chills.  She does feel better.  She normally does not get burning with urination or frequency but instead gets discolored darker urine and other similar symptoms as above.  About a year ago she was found to have kidney stones but thinks they never passed.    She is sexually active, uses condoms.  No concern for STD today.  No prior Pap smear.  Past Medical History:  Diagnosis Date  . Anxiety   . Depression    Current Outpatient Medications on File Prior to Visit  Medication Sig Dispense Refill  . busPIRone (BUSPAR) 5 MG tablet Take 10 mg by mouth daily as needed for anxiety.  (Patient not taking: Reported on 02/02/2021)    . FLUoxetine (PROZAC) 20 MG capsule Take 20 mg by mouth daily. (Patient not taking: Reported on 02/02/2021)    . naproxen (NAPROSYN) 500 MG tablet Take 1 tablet (500 mg total) by mouth 2 (two) times daily. (Patient not taking: Reported on 02/02/2021) 30 tablet 0  . nitrofurantoin, macrocrystal-monohydrate, (MACROBID) 100 MG capsule Take 1 capsule (100 mg total) by mouth 2 (two) times daily. (Patient not taking: Reported on 02/02/2021) 10 capsule 0  . ondansetron (ZOFRAN ODT) 4 MG disintegrating tablet Take 1 tablet (4 mg total) by mouth every 8 (eight) hours as needed for nausea or vomiting. (Patient not taking: Reported on 02/02/2021) 10 tablet 0  . pantoprazole (PROTONIX) 40 MG tablet Take 1 tablet (40 mg total) by mouth daily. (Patient not taking: Reported on 02/02/2021) 14 tablet 0  . phenazopyridine (PYRIDIUM) 200 MG tablet Take 1 tablet (200 mg total) by mouth 3 (three) times daily as needed for pain. (Patient not taking: Reported on 02/02/2021) 6 tablet 0   No current facility-administered medications on file prior to visit.   ROS as in subjective   Objective: BP 92/60   Pulse 90   Ht 5\' 4"  (1.626 m)   Wt 139 lb 3.2 oz (63.1 kg)   SpO2 98%  BMI 23.89 kg/m   Gen: wd, wn, nad white female Right knee tender over medial joint line, tender in the back of the knee particularly on the lateral side and tender over the lateral biceps femoris tendon.  She has tenderness with McMurray's test as well as knee flexion in the area of the biceps femoris tendon.   She has pain when squatting in the medial knee.  She is somewhat guarded.  No swelling no bruising.  Rest of leg nontender with normal range of motion Legs neurovascularly intact Psych: Pleasant, answers questions appropriately    Assessment: Encounter Diagnoses  Name Primary?  . Right knee pain, unspecified chronicity Yes  . Knee injury, initial encounter   . Elevated glucose   . Recurrent  UTI   . Renal stones      Plan: Right knee pain, knee injury-referral to orthopedics as she will likely need an MRI and further evaluation.  In the meantime can use RICE, rest, ice, compression, elevation, NSAID short-term.  Avoid re injury.  Avoid running and soccer currently  We discussed her history of urinary tract infection and I reviewed hospital notes from multiple visits over the past year including labs and CT renal stone study.  About a year ago in April she had 3 mm or less diameter stones in bilateral kidneys.  No hydronephrosis.  She does not think she ever passed the stones.  Interestingly her glucose was elevated on several labs.  Fingerstick glucose today borderline  I recommend she return in 2 weeks for a fasting physical or at least return for clean-catch urine status post recent urinary tract infection/pyelonephritis, possibly repeat blood test at that time to further evaluate borderline sugars  She may ultimately need to see urology if the urinary tract issue continues  Samariyah was seen today for new patient (initial visit) and knee pain.  Diagnoses and all orders for this visit:  Right knee pain, unspecified chronicity -     Ambulatory referral to Orthopedic Surgery  Knee injury, initial encounter -     Ambulatory referral to Orthopedic Surgery  Elevated glucose -     Glucose (CBG), Fasting  Recurrent UTI  Renal stones   F/u 2 wk

## 2021-02-26 ENCOUNTER — Encounter: Payer: Self-pay | Admitting: Medical

## 2021-02-26 ENCOUNTER — Ambulatory Visit (INDEPENDENT_AMBULATORY_CARE_PROVIDER_SITE_OTHER): Admitting: Medical

## 2021-02-26 ENCOUNTER — Other Ambulatory Visit: Payer: Self-pay

## 2021-02-26 VITALS — BP 100/60 | HR 98 | Ht 64.0 in | Wt 143.2 lb

## 2021-02-26 DIAGNOSIS — R8290 Unspecified abnormal findings in urine: Secondary | ICD-10-CM | POA: Insufficient documentation

## 2021-02-26 DIAGNOSIS — M25561 Pain in right knee: Secondary | ICD-10-CM

## 2021-02-26 DIAGNOSIS — Z87442 Personal history of urinary calculi: Secondary | ICD-10-CM | POA: Diagnosis not present

## 2021-02-26 DIAGNOSIS — Z87440 Personal history of urinary (tract) infections: Secondary | ICD-10-CM | POA: Diagnosis not present

## 2021-02-26 LAB — POCT URINALYSIS DIP (PROADVANTAGE DEVICE)
Bilirubin, UA: NEGATIVE
Blood, UA: NEGATIVE
Glucose, UA: NEGATIVE mg/dL
Ketones, POC UA: NEGATIVE mg/dL
Nitrite, UA: NEGATIVE
Protein Ur, POC: NEGATIVE mg/dL
Specific Gravity, Urine: 1.025
Urobilinogen, Ur: 0.2
pH, UA: 6 (ref 5.0–8.0)

## 2021-02-26 NOTE — Progress Notes (Signed)
Subjective: Chief Complaint  Patient presents with  . Knee Pain    Follow up on knee pain    Here for f/u.   I saw her recently as a new patient for multiple concerns including knee pain.  Since last visit here she saw [facility].  Sees them tomorrow for f/u on MRI  Here for f/u on abnormal urinalysis, hx/o renal stones and kidney infection.  Sometimes low back hurts, dark urine in mornings. Last visit she was finishing antibiotics.  No new symptoms or no new antibiotics since last visit here.    Periods regular, should be starting in 2 days.  Not currently on birth control, periods fluctuate q29-33 days.   She notes history of frequent urinary tract infection within the past year.  She probably had treatment maybe 3-4 times this past year for UTI.   She normally does not get burning with urination or frequency but instead gets discolored darker urine and other similar symptoms as above.  About a year ago she was found to have kidney stones but thinks they never passed.    She is sexually active, uses condoms.  No concern for STD today.  No prior Pap smear.  Past Medical History:  Diagnosis Date  . Anxiety   . Depression    No current outpatient medications on file prior to visit.   No current facility-administered medications on file prior to visit.   ROS as in subjective   Objective: BP 100/60   Pulse 98   Ht 5\' 4"  (1.626 m)   Wt 143 lb 3.2 oz (65 kg)   SpO2 97%   BMI 24.58 kg/m   Gen: wd, wn, nad white female Abdomen: mild generalized lower abdominal tenderness, no mass, no organomegaly No back tenderness    Assessment: Encounter Diagnoses  Name Primary?  . Abnormal urinalysis Yes  . History of renal stone   . History of kidney infection   . Right knee pain, unspecified chronicity       Plan: Right knee pain, knee injury-follow up with orthopedics tomorrow as planned to review MRI and next steps  We discussed her history of urinary tract infection and  I reviewed hospital notes from multiple visits over the past year including labs and CT renal stone study.  Creatinine normal on labs 06/2020.  About a year ago in April she had 3 mm or less diameter stones in bilateral kidneys.  No hydronephrosis.  Updated UA today.  She may ultimately need to see urology if the urinary tract issue continues  At this point given improvements in symptoms and urine findings, she will use watch and wait approach of the next 3 months  Follow-up within the next 3 to 4 months.  Also discussed that she is due for a Pap smear baseline.  Lindsey Castro was seen today for knee pain.  Diagnoses and all orders for this visit:  Abnormal urinalysis -     POCT Urinalysis DIP (Proadvantage Device)  History of renal stone -     POCT Urinalysis DIP (Proadvantage Device)  History of kidney infection  Right knee pain, unspecified chronicity   F/u for physical prn

## 2021-05-07 ENCOUNTER — Institutional Professional Consult (permissible substitution): Admitting: Medical

## 2021-05-21 ENCOUNTER — Encounter: Payer: Self-pay | Admitting: Medical

## 2021-06-10 ENCOUNTER — Emergency Department (HOSPITAL_COMMUNITY)
Admission: EM | Admit: 2021-06-10 | Discharge: 2021-06-10 | Disposition: A | Discharge status: Home / Self Care | Attending: Emergency Medicine | Admitting: Emergency Medicine

## 2021-06-10 ENCOUNTER — Other Ambulatory Visit: Payer: Self-pay

## 2021-06-10 ENCOUNTER — Encounter (HOSPITAL_COMMUNITY): Payer: Self-pay

## 2021-06-10 DIAGNOSIS — F10129 Alcohol abuse with intoxication, unspecified: Secondary | ICD-10-CM | POA: Insufficient documentation

## 2021-06-10 DIAGNOSIS — R1030 Lower abdominal pain, unspecified: Secondary | ICD-10-CM | POA: Diagnosis not present

## 2021-06-10 DIAGNOSIS — K226 Gastro-esophageal laceration-hemorrhage syndrome: Secondary | ICD-10-CM

## 2021-06-10 DIAGNOSIS — F10920 Alcohol use, unspecified with intoxication, uncomplicated: Secondary | ICD-10-CM

## 2021-06-10 DIAGNOSIS — K920 Hematemesis: Secondary | ICD-10-CM | POA: Diagnosis present

## 2021-06-10 LAB — CBC WITH DIFFERENTIAL/PLATELET
Abs Immature Granulocytes: 0.02 10*3/uL (ref 0.00–0.07)
Basophils Absolute: 0 10*3/uL (ref 0.0–0.1)
Basophils Relative: 1 %
Eosinophils Absolute: 0 10*3/uL (ref 0.0–0.5)
Eosinophils Relative: 1 %
HCT: 45.7 % (ref 36.0–46.0)
Hemoglobin: 14.9 g/dL (ref 12.0–15.0)
Immature Granulocytes: 0 %
Lymphocytes Relative: 26 %
Lymphs Abs: 2.1 10*3/uL (ref 0.7–4.0)
MCH: 30.7 pg (ref 26.0–34.0)
MCHC: 32.6 g/dL (ref 30.0–36.0)
MCV: 94.2 fL (ref 80.0–100.0)
Monocytes Absolute: 0.3 10*3/uL (ref 0.1–1.0)
Monocytes Relative: 4 %
Neutro Abs: 5.4 10*3/uL (ref 1.7–7.7)
Neutrophils Relative %: 68 %
Platelets: 285 10*3/uL (ref 150–400)
RBC: 4.85 MIL/uL (ref 3.87–5.11)
RDW: 12.4 % (ref 11.5–15.5)
WBC: 7.9 10*3/uL (ref 4.0–10.5)
nRBC: 0 % (ref 0.0–0.2)

## 2021-06-10 LAB — COMPREHENSIVE METABOLIC PANEL
ALT: 22 U/L (ref 0–44)
AST: 23 U/L (ref 15–41)
Albumin: 4.3 g/dL (ref 3.5–5.0)
Alkaline Phosphatase: 69 U/L (ref 38–126)
Anion gap: 9 (ref 5–15)
BUN: 12 mg/dL (ref 6–20)
CO2: 25 mmol/L (ref 22–32)
Calcium: 8.8 mg/dL — ABNORMAL LOW (ref 8.9–10.3)
Chloride: 106 mmol/L (ref 98–111)
Creatinine, Ser: 0.73 mg/dL (ref 0.44–1.00)
GFR, Estimated: >60 mL/min (ref >60)
Glucose, Bld: 103 mg/dL — ABNORMAL HIGH (ref 70–99)
Potassium: 4 mmol/L (ref 3.5–5.1)
Sodium: 140 mmol/L (ref 135–145)
Total Bilirubin: 0.9 mg/dL (ref 0.3–1.2)
Total Protein: 7.9 g/dL (ref 6.5–8.1)

## 2021-06-10 LAB — I-STAT BETA HCG BLOOD, ED (MC, WL, AP ONLY): I-stat hCG, quantitative: <5 m[IU]/mL (ref <5)

## 2021-06-10 LAB — URINALYSIS, ROUTINE W REFLEX MICROSCOPIC
Bacteria, UA: NONE SEEN
Bilirubin Urine: NEGATIVE
Glucose, UA: NEGATIVE mg/dL
Ketones, ur: NEGATIVE mg/dL
Leukocytes,Ua: NEGATIVE
Nitrite: NEGATIVE
Protein, ur: NEGATIVE mg/dL
Specific Gravity, Urine: 1.015 (ref 1.005–1.030)
pH: 6 (ref 5.0–8.0)

## 2021-06-10 LAB — LIPASE, BLOOD: Lipase: 64 U/L — ABNORMAL HIGH (ref 11–51)

## 2021-06-10 MED ORDER — ONDANSETRON 4 MG PO TBDP
4.0000 mg | ORAL_TABLET | Freq: Once | ORAL | Status: AC
  Administered 2021-06-10: 4 mg via ORAL
  Filled 2021-06-10: qty 1

## 2021-06-10 MED ORDER — LIDOCAINE VISCOUS HCL 2 % MT SOLN
15.0000 mL | Freq: Once | OROMUCOSAL | Status: AC
  Administered 2021-06-10: 15 mL via ORAL
  Filled 2021-06-10: qty 15

## 2021-06-10 MED ORDER — ALUM & MAG HYDROXIDE-SIMETH 200-200-20 MG/5ML PO SUSP
30.0000 mL | Freq: Once | ORAL | Status: AC
  Administered 2021-06-10: 30 mL via ORAL
  Filled 2021-06-10: qty 30

## 2021-06-10 MED ORDER — OMEPRAZOLE 20 MG PO CPDR: 20.0000 mg | DELAYED_RELEASE_CAPSULE | Freq: Every day | ORAL | 0 refills | Status: DC

## 2021-06-10 MED ORDER — DICYCLOMINE HCL 10 MG PO CAPS
10.0000 mg | ORAL_CAPSULE | Freq: Once | ORAL | Status: AC
  Administered 2021-06-10: 10 mg via ORAL
  Filled 2021-06-10: qty 1

## 2021-06-10 MED ORDER — SODIUM CHLORIDE 0.9 % IV BOLUS
1000.0000 mL | Freq: Once | INTRAVENOUS | Status: AC
  Administered 2021-06-10: 1000 mL via INTRAVENOUS

## 2021-06-10 NOTE — Discharge Instructions (Addendum)
Thank you for allowing me to care for you today in the Emergency Department.   The blood that was in your vomit with symptoms likely due to a Mallory-Weiss tear.  Your blood work and urine were reassuring today.  Take 1 tablet of omeprazole daily for the next 2 weeks.  I would stop drinking for 1 to 2 weeks to allow the lining of your esophagus time to heal.  Alcohol is an irritant to the esophagus into your stomach and increased alcohol use can put you at risk for bleeding in the esophagus or stomach.  You can take 650 mg of Tylenol every 6 hours for pain.  I would avoid NSAID medications, such as Motrin and naproxen as these medications can also irritate the lining of the esophagus and of the stomach.  Pepcid and Maalox are also available over-the-counter to help with your pain.  Take as directed on the label.  Return to the emergency department if you continue to have persistent episodes of blood in your vomit, if you have black or bloody stool, if you pass out, develop respiratory distress, severe abdominal pain, or other new, concerning symptoms.

## 2021-06-10 NOTE — ED Provider Notes (Signed)
[facility]-EMERGENCY DEPT Provider Note   CSN: [id number] Arrival date & time: 06/10/21  0139     History Chief Complaint  Patient presents with   Hematemesis    Lindsey Castro is a 20 y.o. female who presents emergency department with a chief complaint of hematemesis.  Symptoms have been constant, not worsening.  No known aggravating or alleviating factors.  Patient reports that she drank 4 alcoholic beverages tonight.  After drinking alcohol, she began vomiting and noticed bright red blood in her emesis.  She is accompanied by a friend he reports that the patient was vomiting "large clots of blood".  She reports associated lower abdominal pain and reports a history of recurrent UTIs.  However, she denies urinary frequency, hesitancy, hematuria, dysuria, vaginal bleeding or discharge.  She is sexually active with 1 partner.  Last menstrual cycle was 8/23.  She last had intercourse on 8/24.  She is unsure if she could be pregnant.  She also reports a history of kidney stones.  She denies fever, chills, back pain, flank pain, diarrhea, constipation, chest pain, shortness of breath, or URI symptoms.  No treatment prior to arrival.  The history is provided by the patient and medical records. No language interpreter was used.      Past Medical History:  Diagnosis Date   Anxiety    Depression     Patient Active Problem List   Diagnosis Date Noted   Abnormal urinalysis 02/26/2021   History of renal stone 02/26/2021   History of kidney infection 02/26/2021   Right knee pain 02/02/2021   Knee injury, initial encounter 02/02/2021   Elevated glucose 02/02/2021   Recurrent UTI 02/02/2021   Renal stones 02/02/2021    History reviewed. No pertinent surgical history.   OB History   No obstetric history on file.     Family History  Problem Relation Age of Onset   Healthy Mother    Healthy Father     Social History   Tobacco Use   Smoking status: Never    Smokeless tobacco: Never  Substance Use Topics   Alcohol use: Yes   Drug use: Never    Home Medications Prior to Admission medications   Medication Sig Start Date End Date Taking? Authorizing Provider  omeprazole (PRILOSEC) 20 MG capsule Take 1 capsule (20 mg total) by mouth daily for 14 days. 06/10/21 06/24/21 Yes Valicia Rief A, PA-C    Allergies    Patient has no known allergies.  Review of Systems   Review of Systems  Constitutional:  Negative for activity change, chills, diaphoresis and fever.  HENT:  Negative for congestion and sore throat.   Respiratory:  Negative for cough, shortness of breath and wheezing.   Cardiovascular:  Negative for chest pain and palpitations.  Gastrointestinal:  Positive for abdominal pain and vomiting (hematemesis). Negative for constipation, diarrhea and nausea.  Genitourinary:  Negative for dysuria, flank pain, frequency, hematuria, pelvic pain and urgency.  Musculoskeletal:  Negative for back pain, myalgias, neck pain and neck stiffness.  Skin:  Negative for rash and wound.  Allergic/Immunologic: Negative for immunocompromised state.  Neurological:  Negative for dizziness, seizures, syncope, weakness, numbness and headaches.  Psychiatric/Behavioral:  Negative for confusion.    Physical Exam Updated Vital Signs BP (!) 99/53   Pulse 79   Temp 98.4 F (36.9 C) (Oral)   Resp 18   Ht 5\' 4"  (1.626 m)   Wt 65.8 kg   LMP 05/29/2021   SpO2 98%  BMI 24.89 kg/m   Physical Exam Vitals and nursing note reviewed.  Constitutional:      General: She is not in acute distress.    Appearance: She is not ill-appearing, toxic-appearing or diaphoretic.  HENT:     Head: Normocephalic.  Eyes:     Conjunctiva/sclera: Conjunctivae normal.  Cardiovascular:     Rate and Rhythm: Normal rate and regular rhythm.     Heart sounds: No murmur heard.   No friction rub. No gallop.  Pulmonary:     Effort: Pulmonary effort is normal. No respiratory distress.      Breath sounds: No stridor. No wheezing, rhonchi or rales.  Chest:     Chest wall: No tenderness.  Abdominal:     General: There is no distension.     Palpations: Abdomen is soft. There is no mass.     Tenderness: There is no abdominal tenderness. There is no right CVA tenderness, left CVA tenderness, guarding or rebound.     Hernia: No hernia is present.     Comments: Abdomen is nontender.  Abdomen is soft, nondistended.  Negative Murphy sign.  No CVA tenderness bilaterally.  No tenderness of McBurney's point.  Musculoskeletal:     Cervical back: Neck supple.     Right lower leg: No edema.     Left lower leg: No edema.  Skin:    General: Skin is warm.     Findings: No rash.  Neurological:     Mental Status: She is alert.  Psychiatric:        Behavior: Behavior normal.    ED Results / Procedures / Treatments   Labs (all labs ordered are listed, but only abnormal results are displayed) Labs Reviewed  COMPREHENSIVE METABOLIC PANEL - Abnormal; Notable for the following components:      Result Value   Glucose, Bld 103 (*)    Calcium 8.8 (*)    All other components within normal limits  LIPASE, BLOOD - Abnormal; Notable for the following components:   Lipase 64 (*)    All other components within normal limits  URINALYSIS, ROUTINE W REFLEX MICROSCOPIC - Abnormal; Notable for the following components:   Color, Urine YELLOW (*)    APPearance CLEAR (*)    Hgb urine dipstick TRACE (*)    All other components within normal limits  CBC WITH DIFFERENTIAL/PLATELET  I-STAT BETA HCG BLOOD, ED ([facility], [facility], [facility] ONLY)    EKG None  Radiology No results found.  Procedures Procedures   Medications Ordered in ED Medications  ondansetron (ZOFRAN-ODT) disintegrating tablet 4 mg (4 mg Oral Given 06/10/21 0202)  sodium chloride 0.9 % bolus 1,000 mL (0 mLs Intravenous Stopped 06/10/21 0438)  alum & mag hydroxide-simeth (MAALOX/MYLANTA) 200-200-20 MG/5ML suspension 30 mL (30 mLs Oral Given 06/10/21  0326)    And  lidocaine (XYLOCAINE) 2 % viscous mouth solution 15 mL (15 mLs Oral Given 06/10/21 0325)  dicyclomine (BENTYL) capsule 10 mg (10 mg Oral Given 06/10/21 0330)    ED Course  I have reviewed the triage vital signs and the nursing notes.  Pertinent labs & imaging results that were available during my care of the patient were reviewed by me and considered in my medical decision making (see chart for details).    MDM Rules/Calculators/A&P                           20 year old female with history of kidney stones who presents the  emergency department with a chief complaint of hematemesis.  Patient had several alcoholic beverages earlier tonight and then began vomiting and was noted to have bright red hematemesis and clots in her emesis.  She is also endorsing lower abdominal pain.  No other associated symptoms.  Vital signs are stable.  On exam, she is intoxicated.  Abdomen is benign.  Hemoglobin is stable at 14.9.  Not concern for hemoconcentration.  Lipase is minimally elevated at 64, but does not meet criteria for pancreatitis.  UA is not concerning for infection.  No other metabolic derangements.  Suspect Mallory-Weiss tear.  Doubt GI bleed, pancreatitis, cholecystitis, pyelonephritis, obstructive uropathy, urosepsis, mesenteric ischemia, PID, ectopic pregnancy.  Patient was given GI cocktail and successfully fluid challenge on reevaluation, abdominal pain had resolved.  She was given Zofran and there was no additional episodes of vomiting.  She was observed for more than 3 hours in the emergency department and remained clinically stable.  She was discharged home with a sober adult.  We will give her 2-week course of omeprazole and since lidocaine.  Encouraged alcohol cessation.  ER return precautions given.  She is hemodynamically stable in no acute distress.  Safer discharge home with outpatient follow-up.  Final Clinical Impression(s) / ED Diagnoses Final diagnoses:  Mallory-Weiss  syndrome  Acute alcoholic intoxication without complication (HCC)    Rx / DC Orders ED Discharge Orders          Ordered    omeprazole (PRILOSEC) 20 MG capsule  Daily        06/10/21 0502             Dhrithi Riche A, PA-C 06/10/21 0730    Gilda Crease, MD 06/11/21 0300

## 2021-06-10 NOTE — ED Triage Notes (Signed)
Pt presents with c/o hematemesis. Pt reports she was drinking tonight and began vomiting blood. Pt reports this is normal for her but she became scared when her friend told her that this was not normal.

## 2021-07-03 ENCOUNTER — Encounter: Admitting: Medical

## 2021-07-03 DIAGNOSIS — Z0000 Encounter for general adult medical examination without abnormal findings: Secondary | ICD-10-CM

## 2021-07-12 ENCOUNTER — Encounter: Payer: Self-pay | Admitting: Medical

## 2021-08-24 ENCOUNTER — Telehealth: Payer: Self-pay | Admitting: Internal Medicine

## 2021-08-24 NOTE — Telephone Encounter (Signed)
Tried to call patient but VM is full. I am trying to update chart and patient is overdue on Pap and chlamydia testing. Trying to find out if she had this done and or need an appointment to get this done

## 2021-08-27 ENCOUNTER — Ambulatory Visit (HOSPITAL_COMMUNITY)
Admission: EM | Admit: 2021-08-27 | Discharge: 2021-08-27 | Disposition: A | Discharge status: Home / Self Care | Attending: Emergency Medicine | Admitting: Emergency Medicine

## 2021-08-27 ENCOUNTER — Other Ambulatory Visit: Payer: Self-pay

## 2021-08-27 ENCOUNTER — Encounter (HOSPITAL_COMMUNITY): Payer: Self-pay

## 2021-08-27 DIAGNOSIS — R059 Cough, unspecified: Secondary | ICD-10-CM | POA: Diagnosis not present

## 2021-08-27 DIAGNOSIS — R519 Headache, unspecified: Secondary | ICD-10-CM | POA: Diagnosis not present

## 2021-08-27 DIAGNOSIS — N3001 Acute cystitis with hematuria: Secondary | ICD-10-CM | POA: Insufficient documentation

## 2021-08-27 DIAGNOSIS — R21 Rash and other nonspecific skin eruption: Secondary | ICD-10-CM | POA: Insufficient documentation

## 2021-08-27 DIAGNOSIS — N898 Other specified noninflammatory disorders of vagina: Secondary | ICD-10-CM | POA: Diagnosis not present

## 2021-08-27 DIAGNOSIS — B349 Viral infection, unspecified: Secondary | ICD-10-CM | POA: Insufficient documentation

## 2021-08-27 DIAGNOSIS — R0981 Nasal congestion: Secondary | ICD-10-CM | POA: Diagnosis not present

## 2021-08-27 DIAGNOSIS — Z20822 Contact with and (suspected) exposure to covid-19: Secondary | ICD-10-CM | POA: Diagnosis not present

## 2021-08-27 DIAGNOSIS — R509 Fever, unspecified: Secondary | ICD-10-CM | POA: Diagnosis not present

## 2021-08-27 LAB — POCT URINALYSIS DIPSTICK, ED / UC
Bilirubin Urine: NEGATIVE
Glucose, UA: NEGATIVE mg/dL
Ketones, ur: NEGATIVE mg/dL
Nitrite: POSITIVE — AB
Protein, ur: NEGATIVE mg/dL
Specific Gravity, Urine: 1.025 (ref 1.005–1.030)
Urobilinogen, UA: 0.2 mg/dL (ref 0.0–1.0)
pH: 5.5 (ref 5.0–8.0)

## 2021-08-27 LAB — POC INFLUENZA A AND B ANTIGEN (URGENT CARE ONLY)
INFLUENZA A ANTIGEN, POC: NEGATIVE
INFLUENZA B ANTIGEN, POC: NEGATIVE

## 2021-08-27 MED ORDER — IBUPROFEN 800 MG PO TABS: 800.0000 mg | ORAL_TABLET | Freq: Three times a day (TID) | ORAL | 0 refills | Status: DC

## 2021-08-27 MED ORDER — NITROFURANTOIN MONOHYD MACRO 100 MG PO CAPS: 100.0000 mg | ORAL_CAPSULE | Freq: Two times a day (BID) | ORAL | 0 refills | Status: DC

## 2021-08-27 NOTE — ED Provider Notes (Signed)
[facility]    CSN: [id number] Arrival date & time: 08/27/21  1318      History   Chief Complaint Chief Complaint  Patient presents with   Rash   Vaginal Discharge    HPI Rande Raabe is a 20 y.o. female.   Patient presents with nasal congestion, rhinorrhea, sore throat, nonproductive cough, intermittent generalized headaches, fever and chills that began 1 day ago.  Endorses rash present to face for 2 days.  It does not itch, no drainage . possible sick contact. Has not attempted treatment.   Endorses clear to pink discharge for 1 week.  Associated urinary frequency and urgency for 1 day. Discomfort while urinating but not burning. Sexually active, 2 partners, sometime condom use.  Endorses frequent urinary infections.  Denies hematuria, abdominal pain, flank pain, new rash, vaginal discharge, itching, odor.  Past Medical History:  Diagnosis Date   Anxiety    Depression     Patient Active Problem List   Diagnosis Date Noted   Abnormal urinalysis 02/26/2021   History of renal stone 02/26/2021   History of kidney infection 02/26/2021   Right knee pain 02/02/2021   Knee injury, initial encounter 02/02/2021   Elevated glucose 02/02/2021   Recurrent UTI 02/02/2021   Renal stones 02/02/2021    History reviewed. No pertinent surgical history.  OB History   No obstetric history on file.      Home Medications    Prior to Admission medications   Medication Sig Start Date End Date Taking? Authorizing Provider  omeprazole (PRILOSEC) 20 MG capsule Take 1 capsule (20 mg total) by mouth daily for 14 days. 06/10/21 06/24/21  McDonald, Coral Else, PA-C    Family History Family History  Problem Relation Age of Onset   Healthy Mother    Healthy Father     Social History Social History   Tobacco Use   Smoking status: Never   Smokeless tobacco: Never  Substance Use Topics   Alcohol use: Yes   Drug use: Never     Allergies   Patient has no known  allergies.   Review of Systems Review of Systems  Constitutional:  Positive for chills and fever.  HENT:  Positive for congestion, rhinorrhea and sore throat. Negative for dental problem, drooling, ear discharge, ear pain, facial swelling, hearing loss, mouth sores, nosebleeds, postnasal drip, sinus pressure, sinus pain, sneezing, tinnitus, trouble swallowing and voice change.   Respiratory:  Positive for cough. Negative for apnea, choking, chest tightness, shortness of breath, wheezing and stridor.   Cardiovascular: Negative.   Gastrointestinal: Negative.   Genitourinary:  Positive for frequency, urgency and vaginal discharge. Negative for decreased urine volume, difficulty urinating, dyspareunia, dysuria, enuresis, flank pain, genital sores, hematuria, menstrual problem, pelvic pain, vaginal bleeding and vaginal pain.  Skin:  Positive for rash.  Neurological:  Positive for headaches. Negative for dizziness, tremors, seizures, syncope, facial asymmetry, speech difficulty, weakness, light-headedness and numbness.    Physical Exam Triage Vital Signs ED Triage Vitals  Enc Vitals Group     BP 08/27/21 1415 119/81     Pulse Rate 08/27/21 1415 (!) 129     Resp 08/27/21 1415 18     Temp 08/27/21 1415 99.5 F (37.5 C)     Temp Source 08/27/21 1415 Oral     SpO2 08/27/21 1415 96 %     Weight --      Height --      Head Circumference --      Peak  Flow --      Pain Score 08/27/21 1414 4     Pain Loc --      Pain Edu? --      Excl. in [place]? --    No data found.  Updated Vital Signs BP 119/81 (BP Location: Right Arm)   Pulse (!) 129   Temp 99.5 F (37.5 C) (Oral)   Resp 18   LMP 08/05/2021 (Exact Date)   SpO2 96%   Visual Acuity Right Eye Distance:   Left Eye Distance:   Bilateral Distance:    Right Eye Near:   Left Eye Near:    Bilateral Near:     Physical Exam Constitutional:      Appearance: Normal appearance. She is normal weight.  HENT:     Head: Normocephalic.      Right Ear: Tympanic membrane, ear canal and external ear normal.     Left Ear: Tympanic membrane, ear canal and external ear normal.     Nose: Congestion present. No rhinorrhea.     Mouth/Throat:     Mouth: Mucous membranes are moist.     Pharynx: Posterior oropharyngeal erythema present.  Eyes:     Extraocular Movements: Extraocular movements intact.  Cardiovascular:     Rate and Rhythm: Normal rate and regular rhythm.     Pulses: Normal pulses.     Heart sounds: Normal heart sounds.  Pulmonary:     Effort: Pulmonary effort is normal.     Breath sounds: Normal breath sounds.  Genitourinary:    Comments: Deferred self collect vaginal swab Musculoskeletal:     Cervical back: Normal range of motion.  Skin:    General: Skin is warm and dry.  Neurological:     Mental Status: She is alert and oriented to person, place, and time. Mental status is at baseline.  Psychiatric:        Mood and Affect: Mood normal.        Behavior: Behavior normal.     UC Treatments / Results  Labs (all labs ordered are listed, but only abnormal results are displayed) Labs Reviewed  CERVICOVAGINAL ANCILLARY ONLY    EKG   Radiology No results found.  Procedures Procedures (including critical care time)  Medications Ordered in UC Medications - No data to display  Initial Impression / Assessment and Plan / UC Course  I have reviewed the triage vital signs and the nursing notes.  Pertinent labs & imaging results that were available during my care of the patient were reviewed by me and considered in my medical decision making (see chart for details).  Viral illness Acute cystitis with hematuria  1.  COVID test pending 2.  Flu test negative 3.  Urinalysis for nitrates and leukocytes, sent for culture, endorses urinary infections every 2 to 3 months, given walking referral to urology, nitrofurantoin 100 mg twice daily for 5 days, follow-up with urgent care as needed 4.  STI screening pending,  will treat per protocol, advised abstinence until labs results, treatment is complete if necessary and all symptoms have cleared, advised condom use with all sexual encounters moving forward 5.  Ibuprofen 800 mg 3 times daily as needed, over-the-counter medications for remaining symptom management, school and work note given Final Clinical Impressions(s) / UC Diagnoses   Final diagnoses:  None   Discharge Instructions   None    ED Prescriptions   None    PDMP not reviewed this encounter.   Hans Eden, NP 08/27/21 1649

## 2021-08-27 NOTE — ED Triage Notes (Signed)
Pt reports lower back pain, headache, sore throat, cough x 1 week.   Pt reports bloody vaginal discharge.   Pt reports rash in face x 2 days.

## 2021-08-27 NOTE — Discharge Instructions (Addendum)
We will contact you if your COVID or flu test is positive.  Please quarantine while you wait for the results.  If your test is negative you may resume normal activities.  If your test is positive please continue to quarantine for at least 5 days from your symptom onset or until you are without a fever for at least 24 hours after the medications.    You can take Tylenol and/or Ibuprofen as needed for fever reduction and pain relief.   For cough: honey 1/2 to 1 teaspoon (you can dilute the honey in water or another fluid).  You can also use guaifenesin and dextromethorphan for cough. You can use a humidifier for chest congestion and cough.  If you don't have a humidifier, you can sit in the bathroom with the hot shower running.      For sore throat: try warm salt water gargles, cepacol lozenges, throat spray, warm tea or water with lemon/honey, popsicles or ice, or OTC cold relief medicine for throat discomfort.   For congestion: take a daily anti-histamine like Zyrtec, Claritin, and a oral decongestant, such as pseudoephedrine.  You can also use Flonase 1-2 sprays in each nostril daily.   It is important to stay hydrated: drink plenty of fluids (water, gatorade/powerade/pedialyte, juices, or teas) to keep your throat moisturized and help further relieve irritation/discomfort.    Labs pending 2-3 days, you will be contacted if positive for any sti and treatment will be sent to the pharmacy, you will have to return to the clinic if positive for gonorrhea to receive treatment   Please refrain from having sex until labs results, if positive please refrain from having sex until treatment complete and symptoms resolve   If positive for HIV, Syphilis, Chlamydia  gonorrhea or trichomoniasis please notify partner or partners so they may tested as well  Moving forward, it is recommended you use some form of protection against the transmission of sti infections  such as condoms or dental dams with each sexual  encounter    Your urinalysis is positive for the infection   Take macrobid twice a day for 5 days

## 2021-08-28 LAB — CERVICOVAGINAL ANCILLARY ONLY
Bacterial Vaginitis (gardnerella): POSITIVE — AB
Candida Glabrata: NEGATIVE
Candida Vaginitis: POSITIVE — AB
Chlamydia: NEGATIVE
Comment: NEGATIVE
Comment: NEGATIVE
Comment: NEGATIVE
Comment: NEGATIVE
Comment: NEGATIVE
Comment: NORMAL
Neisseria Gonorrhea: NEGATIVE
Trichomonas: NEGATIVE

## 2021-08-28 LAB — SARS CORONAVIRUS 2 (TAT 6-24 HRS): SARS Coronavirus 2: NEGATIVE

## 2021-08-29 ENCOUNTER — Telehealth (HOSPITAL_COMMUNITY): Payer: Self-pay | Admitting: Emergency Medicine

## 2021-08-29 MED ORDER — METRONIDAZOLE 500 MG PO TABS: 500.0000 mg | ORAL_TABLET | Freq: Two times a day (BID) | ORAL | 0 refills | Status: DC

## 2021-08-29 MED ORDER — FLUCONAZOLE 150 MG PO TABS: 150.0000 mg | ORAL_TABLET | Freq: Once | ORAL | 0 refills | Status: AC

## 2021-11-12 ENCOUNTER — Other Ambulatory Visit (HOSPITAL_COMMUNITY)
Admission: RE | Admit: 2021-11-12 | Discharge: 2021-11-12 | Disposition: A | Discharge status: Home / Self Care | Source: Ambulatory Visit | Attending: Medical | Admitting: Medical

## 2021-11-12 ENCOUNTER — Other Ambulatory Visit: Payer: Self-pay

## 2021-11-12 ENCOUNTER — Encounter: Payer: Self-pay | Admitting: Medical

## 2021-11-12 ENCOUNTER — Ambulatory Visit (INDEPENDENT_AMBULATORY_CARE_PROVIDER_SITE_OTHER): Admitting: Medical

## 2021-11-12 VITALS — BP 122/80 | HR 90 | Wt 155.0 lb

## 2021-11-12 DIAGNOSIS — N390 Urinary tract infection, site not specified: Secondary | ICD-10-CM | POA: Diagnosis not present

## 2021-11-12 DIAGNOSIS — Z124 Encounter for screening for malignant neoplasm of cervix: Secondary | ICD-10-CM | POA: Insufficient documentation

## 2021-11-12 DIAGNOSIS — Z113 Encounter for screening for infections with a predominantly sexual mode of transmission: Secondary | ICD-10-CM

## 2021-11-12 DIAGNOSIS — N926 Irregular menstruation, unspecified: Secondary | ICD-10-CM | POA: Diagnosis not present

## 2021-11-12 DIAGNOSIS — Z87442 Personal history of urinary calculi: Secondary | ICD-10-CM

## 2021-11-12 DIAGNOSIS — N898 Other specified noninflammatory disorders of vagina: Secondary | ICD-10-CM | POA: Diagnosis not present

## 2021-11-12 DIAGNOSIS — Z23 Encounter for immunization: Secondary | ICD-10-CM | POA: Diagnosis not present

## 2021-11-12 DIAGNOSIS — Z130 Encounter for screening for diseases of the blood and blood-forming organs and certain disorders involving the immune mechanism: Secondary | ICD-10-CM

## 2021-11-12 LAB — CBC
Hematocrit: 42.8 % (ref 34.0–46.6)
Hemoglobin: 14.4 g/dL (ref 11.1–15.9)
MCH: 31.7 pg (ref 26.6–33.0)
MCHC: 33.6 g/dL (ref 31.5–35.7)
MCV: 94 fL (ref 79–97)
Platelets: 279 10*3/uL (ref 150–450)
RBC: 4.54 x10E6/uL (ref 3.77–5.28)
RDW: 12.3 % (ref 11.7–15.4)
WBC: 7.9 10*3/uL (ref 3.4–10.8)

## 2021-11-12 LAB — POCT URINALYSIS DIP (PROADVANTAGE DEVICE)
Bilirubin, UA: NEGATIVE
Blood, UA: NEGATIVE
Glucose, UA: NEGATIVE mg/dL
Ketones, POC UA: NEGATIVE mg/dL
Leukocytes, UA: NEGATIVE
Nitrite, UA: POSITIVE — AB
Protein Ur, POC: NEGATIVE mg/dL
Specific Gravity, Urine: 1.025
Urobilinogen, Ur: NEGATIVE
pH, UA: 6 (ref 5.0–8.0)

## 2021-11-12 LAB — POCT URINE PREGNANCY: Preg Test, Ur: NEGATIVE

## 2021-11-12 NOTE — Progress Notes (Signed)
Subjective:  Lindsey Castro is a 21 y.o. female who presents for Chief Complaint  Patient presents with   wants pregnancy test    Late period . Start on February 1st. Unprotected sex on 2/16. Brown discharge on 1/28, never get any kind of discharge, having nausea and HAs, breasts are sensitive sometimes. Never missed a period. Would like STD checked as well. Never had pap smear done either     Here for several concerns.  Her last menstrual period was October 08, 2021.  She missed her period last week.  She has never had a missed period before.  Her periods are generally brief just a few days that can be heavy but she does have regular periods every 28 days.  She does note unprotected sex October 22, 2021.  She notes a few days of brown discharge 11/03/21 but none since.  Currently she has no urinary or vaginal symptoms.  She has had some tender breast although she is not sure if this is something she is getting worked up about if it is real.  Not on birth control, never been on birth control.  She has considered Nexplanon.  She has had some discomfort in the right lower quadrant of her abdomen but gets cramps before her period typically.  She has had some nausea as well.  Generally around her menstrual periods, she gets little pimple-like things around her lips.  This did not occur last week like usual.   Some dizziness, no hx/o iron deficiency or anemia.  Sometimes if she stands up too fast gets dizzy.    She does have a prior history of recurrent urinary tract infection.  She has never seen a urologist.  She also notes a history of kidney stones.  She has had at least 3 urinary tract infections in the past year.  No other aggravating or relieving factors.    No other c/o.  The following portions of the patient's history were reviewed and updated as appropriate: allergies, current medications, past family history, past medical history, past social history, past surgical history and problem  list.  ROS Otherwise as in subjective above    Objective: BP 122/80    Pulse 90    Wt 155 lb (70.3 kg)    BMI 26.61 kg/m   General appearance: alert, no distress, well developed, well nourished Neck: supple, no lymphadenopathy, no thyromegaly, no masses Heart: RRR, normal S1, S2, no murmurs Lungs: CTA bilaterally, no wheezes, rhonchi, or rales Abdomen: +bs, soft, non tender, non distended, no masses, no hepatomegaly, no splenomegaly Pulses: 2+ radial pulses, 2+ pedal pulses, normal cap refill Ext: no edema Gyn: Normal external genitalia without lesions, vagina with normal mucosa, cervix with mild erythema, no cervical motion tenderness, mild watery white vaginal discharge.  Uterus and adnexa not enlarged, nontender, no masses.  Pap performed.  Exam chaperoned by nurse.    Assessment: Encounter Diagnoses  Name Primary?   Missed period Yes   Frequent UTI    Vaginal discharge    Screening for cervical cancer    Screen for STD (sexually transmitted disease)    Screening for deficiency anemia    History of kidney stones    Needs flu shot      Plan: Missed period - pregnancy hCG lab today, counseled on birth control options.  She will consider.  She is leaning towards Nexplanon.   Advised she call back and let me know how she wants to proceed assuming hcg negative  Vaginal  discharge, recent unprotected sex - testing today as below, counseling on condom use, protection  Baseline pap smear today.  Discussed guidelines for pap smear.  History of frequent UTI and kidney stones - referral to urology  Counseled on the influenza virus vaccine.  Vaccine information sheet given.   Influenza vaccine given after consent obtained.   Lindsey Castro was seen today for wants pregnancy test.  Diagnoses and all orders for this visit:  Missed period -     POCT urine pregnancy -     POCT Urinalysis DIP ([facility] Device) -     Beta hCG quant (ref lab)  Frequent UTI -     Urine Culture -      Ambulatory referral to Urology -     CBC  Vaginal discharge -     Cytology - PAP([facility]) -     HIV Antibody (routine testing w rflx) -     RPR  Screening for cervical cancer -     Cytology - PAP([facility])  Screen for STD (sexually transmitted disease) -     Cytology - PAP([facility]) -     HIV Antibody (routine testing w rflx) -     RPR  Screening for deficiency anemia -     CBC  History of kidney stones  Needs flu shot -     Flu Vaccine QUAD 7mo+IM (Fluarix, Fluzone & Alfiuria Quad PF)    Follow up: pending labs

## 2021-11-13 LAB — RPR: RPR Ser Ql: NONREACTIVE

## 2021-11-13 LAB — BETA HCG QUANT (REF LAB): hCG Quant: <1 m[IU]/mL

## 2021-11-13 LAB — HIV ANTIBODY (ROUTINE TESTING W REFLEX): HIV Screen 4th Generation wRfx: NONREACTIVE

## 2021-11-14 ENCOUNTER — Other Ambulatory Visit: Payer: Self-pay | Admitting: Medical

## 2021-11-14 LAB — URINE CULTURE

## 2021-11-14 MED ORDER — SULFAMETHOXAZOLE-TRIMETHOPRIM 800-160 MG PO TABS: 1.0000 | ORAL_TABLET | Freq: Two times a day (BID) | ORAL | 0 refills | Status: AC

## 2021-11-15 LAB — CYTOLOGY - PAP
Chlamydia: NEGATIVE
Comment: NEGATIVE
Comment: NEGATIVE
Comment: NORMAL
Neisseria Gonorrhea: NEGATIVE
Trichomonas: NEGATIVE

## 2022-07-22 IMAGING — DX DG KNEE COMPLETE 4+V*R*
4 series · 4 of 4 positions shown · non-contrast
Comparison: None.

CLINICAL DATA: Lateral joint line tenderness and effusion after
fall

EXAM:
RIGHT KNEE - COMPLETE 4+ VIEW

[knee ap]
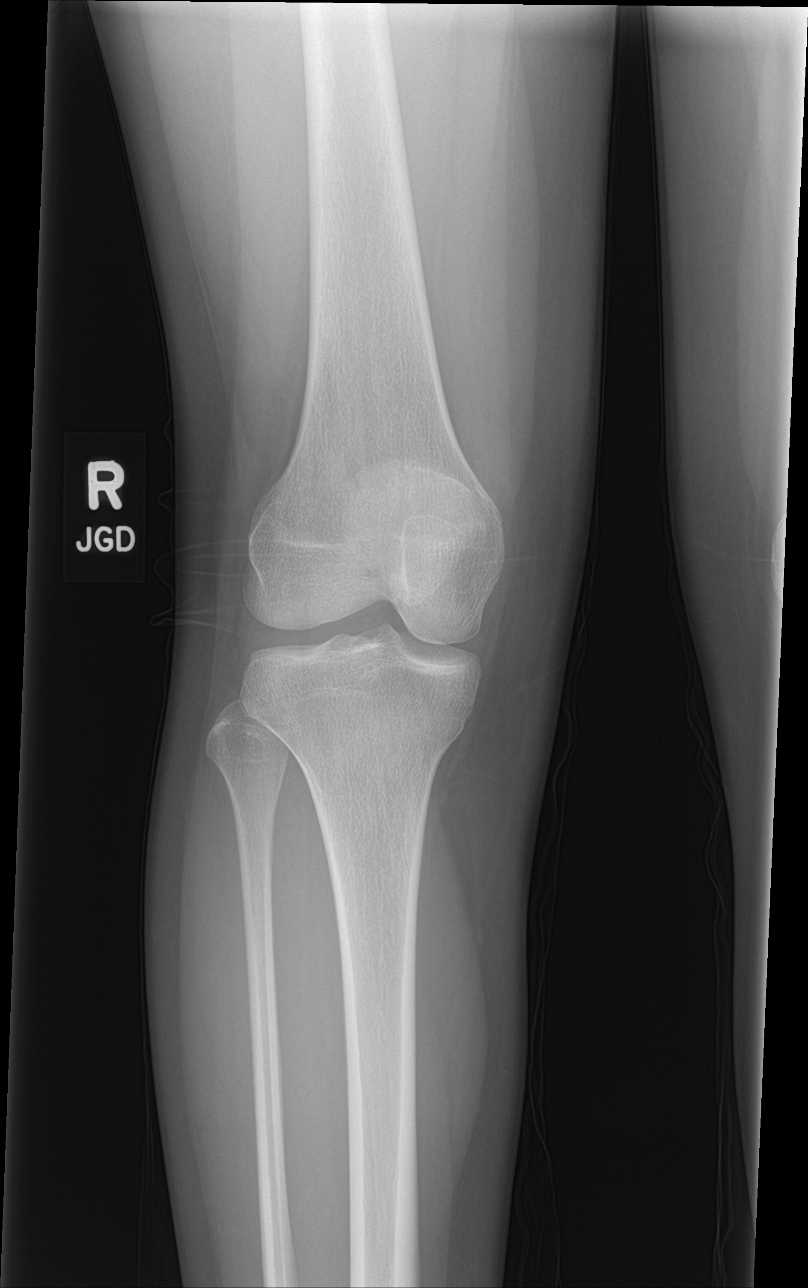

[knee obl (1 of 2)]
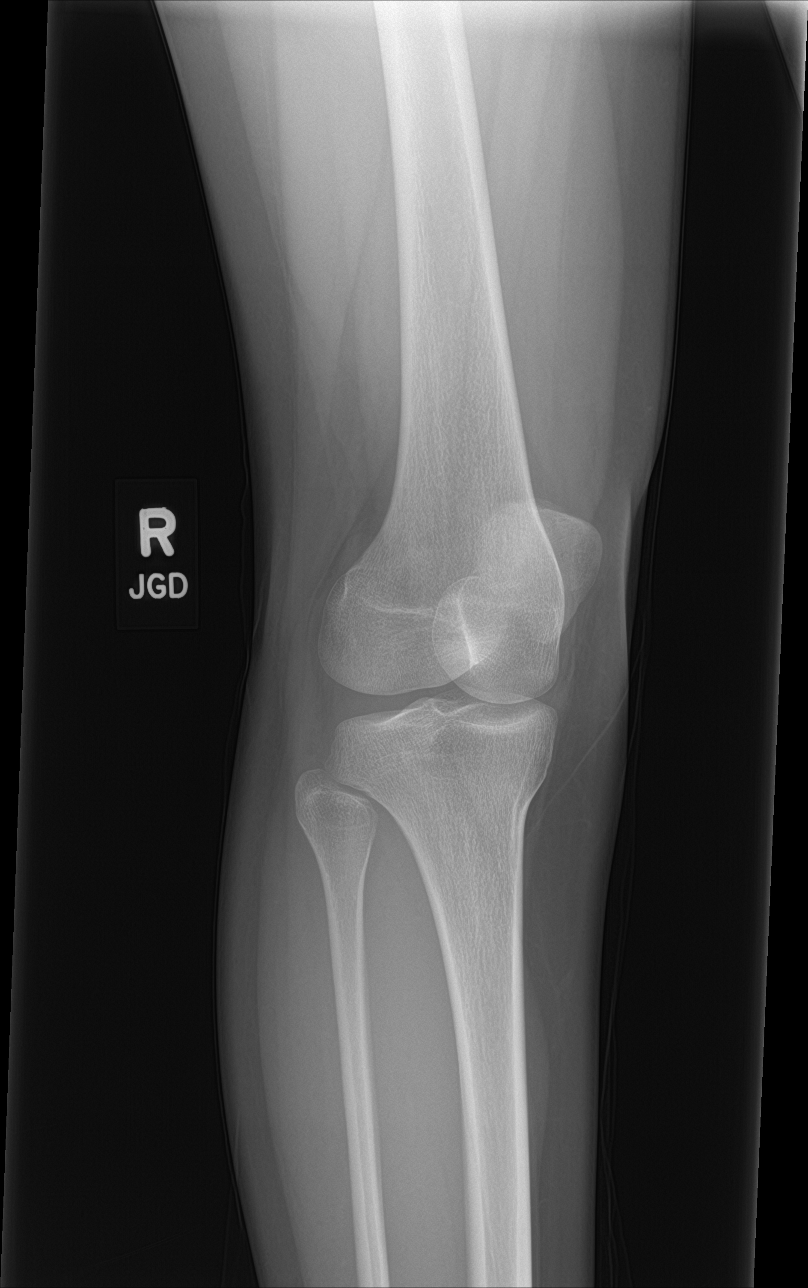

[knee obl (2 of 2)]
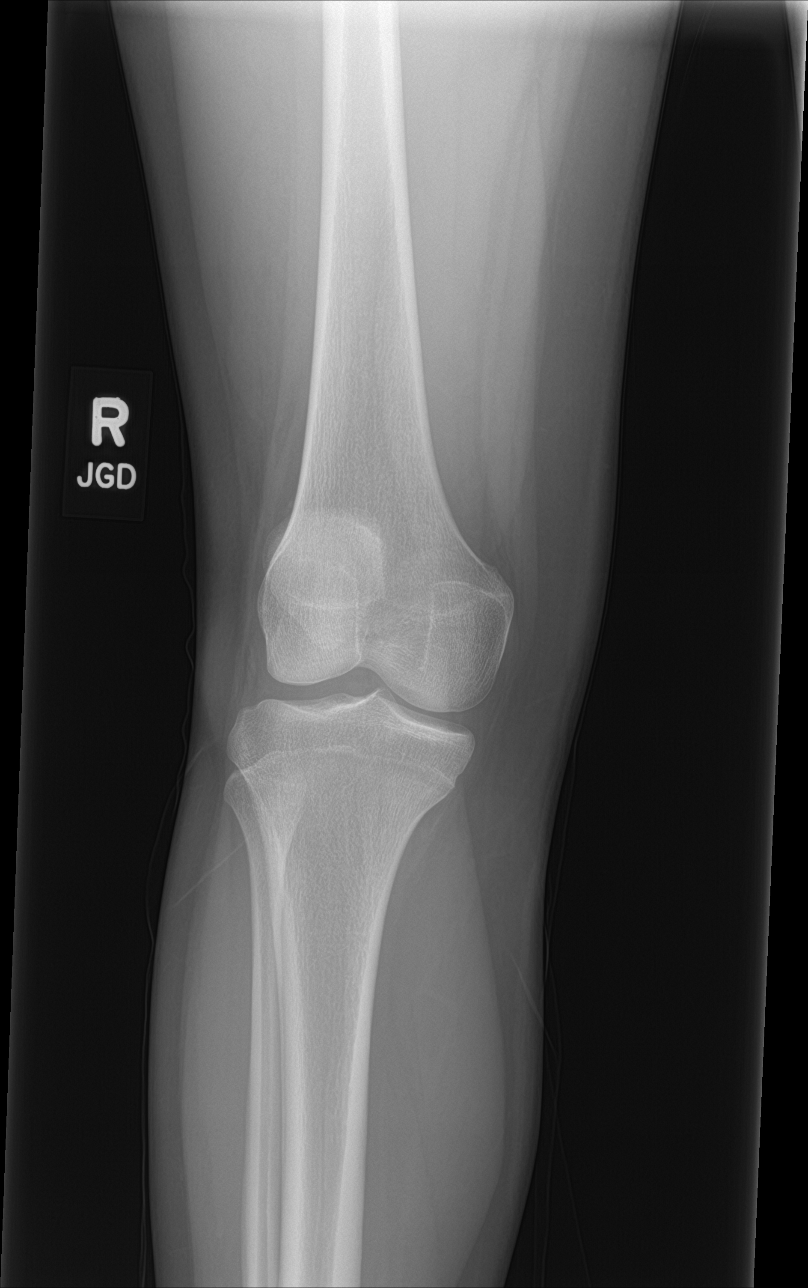

[knee lat]
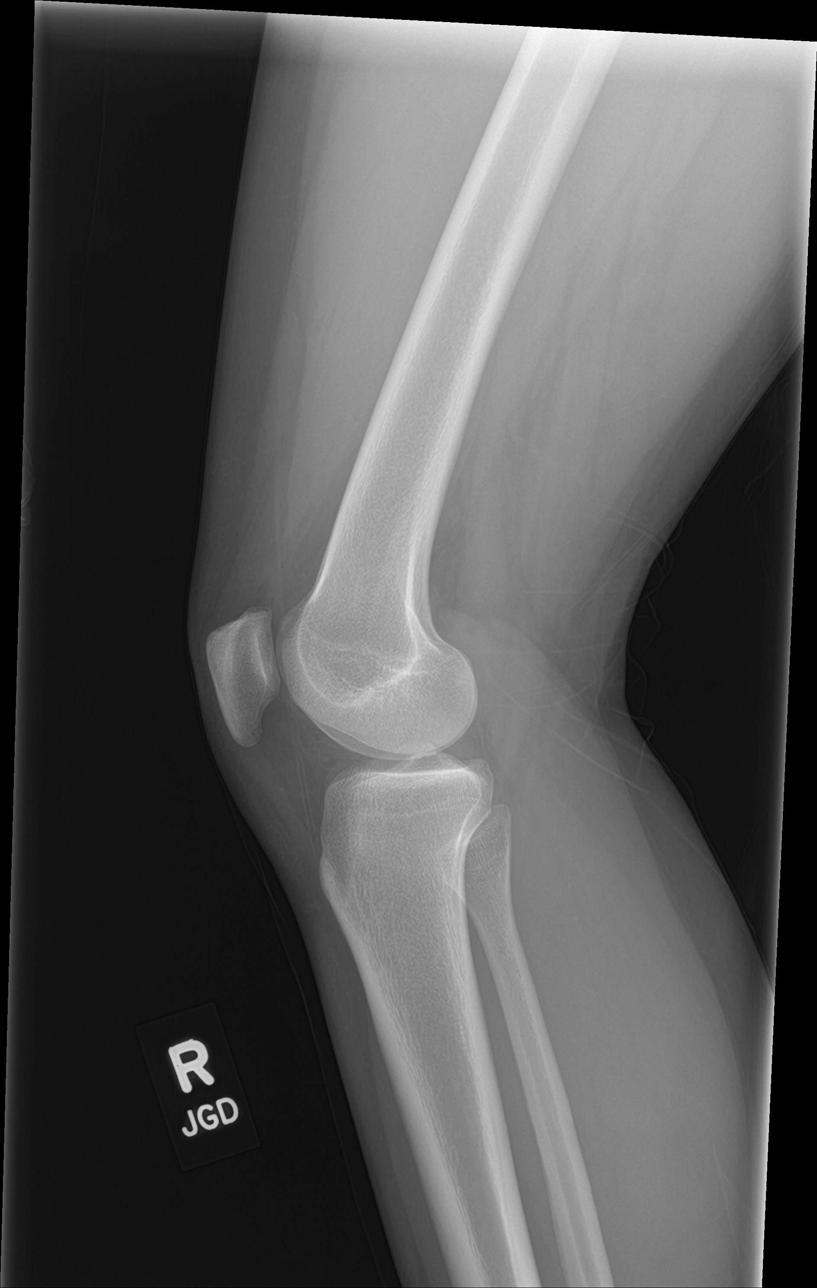

[4 of 4 positions shown; findings below may reference images not displayed]

FINDINGS: Frontal, bilateral oblique, lateral views of the right knee are
obtained. No fracture, subluxation, or dislocation. Joint spaces are
well preserved. Soft tissues are unremarkable. No joint effusion.
IMPRESSION: 1. Unremarkable right knee.

## 2023-02-24 ENCOUNTER — Telehealth: Payer: Self-pay | Admitting: Internal Medicine

## 2023-02-24 NOTE — Transitions of Care (Post Inpatient/ED Visit) (Signed)
   02/24/2023  Name: Lindsey Castro MRN: [id number] DOB: 02-18-2001  Today's TOC FU Call Status: Today's TOC FU Call Status:: Unsuccessul Call (1st Attempt) Unsuccessful Call (1st Attempt) Date: 02/24/23  Attempted to reach the patient regarding the most recent Inpatient/ED visit.  Follow Up Plan: No further outreach attempts will be made at this time. We have been unable to contact the patient.  [employer]

## 2023-02-26 ENCOUNTER — Telehealth: Payer: Self-pay

## 2023-02-26 NOTE — Transitions of Care (Post Inpatient/ED Visit) (Signed)
   02/26/2023  Name: Lindsey Castro MRN: [id number] DOB: 2001-07-11  Today's TOC FU Call Status: Today's TOC FU Call Status:: Unsuccessful Call (2nd Attempt) Unsuccessful Call (2nd Attempt) Date: 02/26/23  Attempted to reach the patient regarding the most recent Inpatient/ED visit.  Follow Up Plan: Additional outreach attempts will be made to reach the patient to complete the Transitions of Care (Post Inpatient/ED visit) call.   Signature Taray Normoyle RMA

## 2023-02-28 ENCOUNTER — Telehealth: Payer: Self-pay

## 2023-02-28 NOTE — Transitions of Care (Post Inpatient/ED Visit) (Signed)
   02/28/2023  Name: Roshanda Thornock MRN: [id number] DOB: Apr 14, 2001  Today's TOC FU Call Status: Today's TOC FU Call Status:: Unsuccessful Call (3rd Attempt) Unsuccessful Call (3rd Attempt) Date: 02/28/23  Attempted to reach the patient regarding the most recent Inpatient/ED visit.  Follow Up Plan: No further outreach attempts will be made at this time. We have been unable to contact the patient.  Signature Derico Mitton RMA
